# Patient Record
Sex: Female | Born: 1960 | Race: White | Hispanic: No | Marital: Married | Smoking: Never smoker
Health system: Southern US, Community
[De-identification: ages and names within clinical notes are randomized; demographics above are authoritative.]

## PROBLEM LIST (undated history)

## (undated) ENCOUNTER — Ambulatory Visit: Admission: EM

## (undated) DIAGNOSIS — E785 Hyperlipidemia, unspecified: Secondary | ICD-10-CM

## (undated) DIAGNOSIS — I4891 Unspecified atrial fibrillation: Secondary | ICD-10-CM

## (undated) DIAGNOSIS — K289 Gastrojejunal ulcer, unspecified as acute or chronic, without hemorrhage or perforation: Secondary | ICD-10-CM

## (undated) DIAGNOSIS — Z9889 Other specified postprocedural states: Secondary | ICD-10-CM

## (undated) DIAGNOSIS — Z7901 Long term (current) use of anticoagulants: Secondary | ICD-10-CM

## (undated) DIAGNOSIS — E669 Obesity, unspecified: Secondary | ICD-10-CM

## (undated) DIAGNOSIS — K219 Gastro-esophageal reflux disease without esophagitis: Secondary | ICD-10-CM

## (undated) DIAGNOSIS — T753XXA Motion sickness, initial encounter: Secondary | ICD-10-CM

## (undated) DIAGNOSIS — K2970 Gastritis, unspecified, without bleeding: Secondary | ICD-10-CM

## (undated) DIAGNOSIS — R112 Nausea with vomiting, unspecified: Secondary | ICD-10-CM

## (undated) DIAGNOSIS — I499 Cardiac arrhythmia, unspecified: Secondary | ICD-10-CM

## (undated) DIAGNOSIS — I10 Essential (primary) hypertension: Secondary | ICD-10-CM

## (undated) DIAGNOSIS — I700 Atherosclerosis of aorta: Secondary | ICD-10-CM

## (undated) HISTORY — DX: Gastro-esophageal reflux disease without esophagitis: K21.9

## (undated) HISTORY — DX: Essential (primary) hypertension: I10

## (undated) HISTORY — PX: CHOLECYSTECTOMY: SHX55

## (undated) HISTORY — DX: Obesity, unspecified: E66.9

## (undated) HISTORY — PX: ABDOMINAL HYSTERECTOMY: SHX81

## (undated) HISTORY — DX: Gastrojejunal ulcer, unspecified as acute or chronic, without hemorrhage or perforation: K28.9

## (undated) HISTORY — DX: Gastritis, unspecified, without bleeding: K29.70

## (undated) HISTORY — PX: OOPHORECTOMY: SHX86

## (undated) HISTORY — PX: CARDIAC ELECTROPHYSIOLOGY MAPPING AND ABLATION: SHX1292

## (undated) HISTORY — DX: Hyperlipidemia, unspecified: E78.5

---

## 2004-05-24 ENCOUNTER — Ambulatory Visit: Payer: Self-pay | Admitting: Internal Medicine

## 2004-06-18 ENCOUNTER — Ambulatory Visit: Payer: Self-pay | Admitting: Surgery

## 2005-09-19 ENCOUNTER — Other Ambulatory Visit: Payer: Self-pay

## 2005-09-27 ENCOUNTER — Ambulatory Visit: Payer: Self-pay | Admitting: Unknown Physician Specialty

## 2006-06-16 ENCOUNTER — Other Ambulatory Visit: Payer: Self-pay

## 2006-06-16 ENCOUNTER — Emergency Department: Payer: Self-pay

## 2007-10-26 ENCOUNTER — Other Ambulatory Visit: Payer: Self-pay

## 2007-10-26 ENCOUNTER — Inpatient Hospital Stay: Payer: Self-pay | Admitting: Internal Medicine

## 2007-10-29 ENCOUNTER — Other Ambulatory Visit: Payer: Self-pay

## 2007-12-27 ENCOUNTER — Ambulatory Visit: Payer: Self-pay | Admitting: Internal Medicine

## 2008-01-02 ENCOUNTER — Other Ambulatory Visit: Payer: Self-pay

## 2008-01-02 ENCOUNTER — Ambulatory Visit: Payer: Self-pay | Admitting: Internal Medicine

## 2008-01-22 ENCOUNTER — Ambulatory Visit: Payer: Self-pay | Admitting: Internal Medicine

## 2008-02-06 ENCOUNTER — Ambulatory Visit: Payer: Self-pay | Admitting: Internal Medicine

## 2008-02-06 ENCOUNTER — Other Ambulatory Visit: Payer: Self-pay

## 2008-05-08 HISTORY — PX: CARDIAC ELECTROPHYSIOLOGY MAPPING AND ABLATION: SHX1292

## 2008-08-04 DIAGNOSIS — I77810 Thoracic aortic ectasia: Secondary | ICD-10-CM

## 2008-08-04 HISTORY — DX: Thoracic aortic ectasia: I77.810

## 2009-01-22 ENCOUNTER — Ambulatory Visit: Payer: Self-pay | Admitting: Internal Medicine

## 2010-03-18 ENCOUNTER — Ambulatory Visit: Payer: Self-pay | Admitting: Internal Medicine

## 2010-09-06 ENCOUNTER — Ambulatory Visit: Payer: Self-pay | Admitting: Gastroenterology

## 2011-05-18 ENCOUNTER — Ambulatory Visit: Payer: Self-pay | Admitting: Internal Medicine

## 2012-07-10 ENCOUNTER — Ambulatory Visit: Payer: Self-pay | Admitting: Internal Medicine

## 2013-03-29 DIAGNOSIS — I493 Ventricular premature depolarization: Secondary | ICD-10-CM | POA: Insufficient documentation

## 2013-07-15 ENCOUNTER — Ambulatory Visit: Payer: Self-pay | Admitting: Internal Medicine

## 2014-08-28 ENCOUNTER — Ambulatory Visit: Payer: Self-pay | Admitting: Internal Medicine

## 2015-11-11 ENCOUNTER — Other Ambulatory Visit: Payer: Self-pay | Admitting: Internal Medicine

## 2015-11-11 DIAGNOSIS — Z1231 Encounter for screening mammogram for malignant neoplasm of breast: Secondary | ICD-10-CM

## 2015-12-02 ENCOUNTER — Other Ambulatory Visit: Payer: Self-pay | Admitting: Internal Medicine

## 2015-12-02 ENCOUNTER — Ambulatory Visit
Admission: RE | Admit: 2015-12-02 | Discharge: 2015-12-02 | Disposition: A | Discharge status: Home / Self Care | Payer: 59 | Source: Ambulatory Visit | Attending: Internal Medicine | Admitting: Internal Medicine

## 2015-12-02 DIAGNOSIS — Z1231 Encounter for screening mammogram for malignant neoplasm of breast: Secondary | ICD-10-CM

## 2016-05-05 ENCOUNTER — Encounter: Payer: Self-pay | Admitting: Emergency Medicine

## 2016-05-05 DIAGNOSIS — M7989 Other specified soft tissue disorders: Secondary | ICD-10-CM | POA: Diagnosis present

## 2016-05-05 DIAGNOSIS — R609 Edema, unspecified: Secondary | ICD-10-CM | POA: Diagnosis not present

## 2016-05-05 NOTE — ED Triage Notes (Signed)
Pt in with co area to left arm that is swollen and bruised. Denies any injury or pain, takes a asa 325mg  daily.

## 2016-05-06 ENCOUNTER — Emergency Department: Payer: 59

## 2016-05-06 ENCOUNTER — Emergency Department
Admission: EM | Admit: 2016-05-06 | Discharge: 2016-05-06 | Disposition: A | Discharge status: Home / Self Care | Payer: 59 | Attending: Emergency Medicine | Admitting: Emergency Medicine

## 2016-05-06 DIAGNOSIS — M7989 Other specified soft tissue disorders: Secondary | ICD-10-CM

## 2016-05-06 DIAGNOSIS — M79609 Pain in unspecified limb: Secondary | ICD-10-CM

## 2016-05-06 DIAGNOSIS — R609 Edema, unspecified: Secondary | ICD-10-CM

## 2016-05-06 DIAGNOSIS — M799 Soft tissue disorder, unspecified: Secondary | ICD-10-CM

## 2016-05-06 NOTE — ED Notes (Signed)
ED Provider at bedside at this time.

## 2016-05-06 NOTE — ED Provider Notes (Signed)
[facility] Emergency Department Provider Note  ____________________________________________  Time seen: 1:38AM  I have reviewed the triage vital signs and the nursing notes.   HISTORY  Chief Complaint Arm Pain     HPI Barbara Johnston is a 55 y.o. female presents with nontraumatic left forearm painless swelling and bruising. Patient stated that she noted the area today and denies any injury that she is aware of. Patient states that areas pain was to palpation. Patient denies any chest pain or shortness of breath.     Past medical history None There are no active problems to display for this patient.   Past surgical history None   Allergies Codeine  No family history on file.  Social History Social History  Substance Use Topics  . Smoking status: Not on file  . Smokeless tobacco: Not on file  . Alcohol use Not on file    Review of Systems  Constitutional: Negative for fever. Eyes: Negative for visual changes. ENT: Negative for sore throat. Cardiovascular: Negative for chest pain. Respiratory: Negative for shortness of breath. Gastrointestinal: Negative for abdominal pain, vomiting and diarrhea. Genitourinary: Negative for dysuria. Musculoskeletal: Negative for back pain. Skin: Negative for rash. Right forearm quarter size ecchymosis, swollen Neurological: Negative for headaches, focal weakness or numbness.  10-point ROS otherwise negative.  ____________________________________________   PHYSICAL EXAM:  VITAL SIGNS: ED Triage Vitals  Enc Vitals Group     BP 05/05/16 2248 (!) 158/86     Pulse Rate 05/05/16 2248 66     Resp 05/05/16 2248 18     Temp 05/05/16 2248 98.6 F (37 C)     Temp src --      SpO2 05/05/16 2248 100 %     Weight 05/05/16 2245 192 lb (87.1 kg)     Height 05/05/16 2245 5\' 2"  (1.575 m)     Head Circumference --      Peak Flow --      Pain Score 05/05/16 2246 2     Pain Loc --      Pain Edu? --       Excl. in [place]? --      Constitutional: Alert and oriented. Well appearing and in no distress. Eyes: Conjunctivae are normal. PERRL. Normal extraocular movements. ENT   Head: Normocephalic and atraumatic.   Nose: No congestion/rhinnorhea.   Mouth/Throat: Mucous membranes are moist.   Neck: No stridor. Hematological/Lymphatic/Immunilogical: No cervical lymphadenopathy. Cardiovascular: Normal rate, regular rhythm. Normal and symmetric distal pulses are present in all extremities. No murmurs, rubs, or gallops. Respiratory: Normal respiratory effort without tachypnea nor retractions. Breath sounds are clear and equal bilaterally. No wheezes/rales/rhonchi. Gastrointestinal: Soft and nontender. No distention. There is no CVA tenderness. Genitourinary: deferred Musculoskeletal: Nontender with normal range of motion in all extremities. No joint effusions.  No lower extremity tenderness nor edema. Neurologic:  Normal speech and language. No gross focal neurologic deficits are appreciated. Speech is normal.  Skin:  Quarter size area of swelling and ecchymoses noted dorsal aspect of the distal left forearm Psychiatric: Mood and affect are normal. Speech and behavior are normal. Patient exhibits appropriate insight and judgment.    RADIOLOGY  CLINICAL DATA:  Initial evaluation for nontraumatic left upper extremity swelling. Bruising.  EXAM: Left UPPER EXTREMITY VENOUS DOPPLER ULTRASOUND  TECHNIQUE: Gray-scale sonography with graded compression, as well as color Doppler and duplex ultrasound were performed to evaluate the upper extremity deep venous system from the level of the subclavian vein and including the  jugular, axillary, basilic, radial, ulnar and upper cephalic vein. Spectral Doppler was utilized to evaluate flow at rest and with distal augmentation maneuvers.  COMPARISON:  None available.  FINDINGS: Contralateral Subclavian Vein: Respiratory phasicity is normal  and symmetric with the symptomatic side. No evidence of thrombus. Normal compressibility.  Internal Jugular Vein: No evidence of thrombus. Normal compressibility, respiratory phasicity and response to augmentation.  Subclavian Vein: No evidence of thrombus. Normal compressibility, respiratory phasicity and response to augmentation.  Axillary Vein: No evidence of thrombus. Normal compressibility, respiratory phasicity and response to augmentation.  Cephalic Vein: No evidence of thrombus. Normal compressibility, respiratory phasicity and response to augmentation.  Basilic Vein: No evidence of thrombus. Normal compressibility, respiratory phasicity and response to augmentation.  Brachial Veins: No evidence of thrombus. Normal compressibility, respiratory phasicity and response to augmentation.  Radial Veins: No evidence of thrombus. Normal compressibility, respiratory phasicity and response to augmentation.  Ulnar Veins: No evidence of thrombus. Normal compressibility, respiratory phasicity and response to augmentation.  Venous Reflux:  None visualized.  Other Findings:  None visualized.  IMPRESSION: No evidence of deep venous thrombosis.   Electronically Signed   By: Jeannine Boga M.D.   On: 05/06/2016 03:14   Procedures      INITIAL IMPRESSION / ASSESSMENT AND PLAN / ED COURSE  Pertinent labs & imaging results that were available during my care of the patient were reviewed by me and considered in my medical decision making (see chart for details).  No clear etiology for the patient's area of ecchymoses and swelling on the forearm. Ultrasound venous study revealed no evidence of DVT or PE.   ____________________________________________   FINAL CLINICAL IMPRESSION(S) / ED DIAGNOSES  Final diagnoses:  Nontraumatic pain and swelling of upper extremity      Gregor Hams, MD 05/11/16 [phone]

## 2016-07-01 ENCOUNTER — Ambulatory Visit (INDEPENDENT_AMBULATORY_CARE_PROVIDER_SITE_OTHER): Payer: 59 | Admitting: Podiatry

## 2016-07-01 ENCOUNTER — Ambulatory Visit (INDEPENDENT_AMBULATORY_CARE_PROVIDER_SITE_OTHER): Payer: 59

## 2016-07-01 VITALS — BP 125/75 | HR 67 | Resp 16

## 2016-07-01 DIAGNOSIS — M25572 Pain in left ankle and joints of left foot: Secondary | ICD-10-CM

## 2016-07-01 DIAGNOSIS — M7752 Other enthesopathy of left foot: Secondary | ICD-10-CM | POA: Diagnosis not present

## 2016-07-01 DIAGNOSIS — M659 Synovitis and tenosynovitis, unspecified: Secondary | ICD-10-CM

## 2016-07-01 DIAGNOSIS — S93402A Sprain of unspecified ligament of left ankle, initial encounter: Secondary | ICD-10-CM

## 2016-07-01 MED ORDER — MELOXICAM 15 MG PO TABS
15.0000 mg | ORAL_TABLET | Freq: Every day | ORAL | 1 refills | Status: AC
Start: 2016-07-01 — End: 2016-07-31

## 2016-07-01 NOTE — Progress Notes (Signed)
   Subjective:    Patient ID: Barbara Johnston, female    DOB: Mar 26, 1961, 55 y.o.   MRN: [id number]  HPI    Review of Systems  All other systems reviewed and are negative.      Objective:   Physical Exam        Assessment & Plan:

## 2016-07-06 MED ORDER — BETAMETHASONE SOD PHOS & ACET 6 (3-3) MG/ML IJ SUSP: 3.0000 mg | Freq: Once | INTRAMUSCULAR | Status: DC

## 2016-07-06 NOTE — Progress Notes (Signed)
Patient ID: Barbara Johnston, female   DOB: 25-Dec-1960, 56 y.o.   MRN: [id number]   Subjective:  Patient presents today for pain and tenderness to the left ankle. Patient relates significant pain and tenderness when walking. Patient states that she rolled her ankle 3-4 weeks ago. He states that for the last 3 days is been feeling much better. Patient presents for further treatment and evaluation.  Objective / Physical Exam:  General:  The patient is alert and oriented x3 in no acute distress. Dermatology:  Skin is warm, dry and supple bilateral lower extremities. Negative for open lesions or macerations. Vascular:  Palpable pedal pulses bilaterally. No edema or erythema noted. Capillary refill within normal limits. Neurological:  Epicritic and protective threshold grossly intact bilaterally.  Musculoskeletal Exam:  Pain on palpation to the anterior lateral medial aspects of the patient's left ankle. Mild edema noted.  Range of motion within normal limits to all pedal and ankle joints bilateral. Muscle strength 5/5 in all groups bilateral.   Radiographic Exam:  Normal osseous mineralization. Joint spaces preserved. No fracture/dislocation/boney destruction.    Assessment: #1 pain in left ankle #2 synovitis of left ankle #3 capsulitis of left ankle #4 ankle sprain left  Plan of Care:  #1 Patient was evaluated. #2 injection of 0.5 mL Celestone Soluspan injected in the patient's left ankle. #3 prescription for meloxicam 15 mg  #4 ankle brace dispensed #5 return to clinic when necessary   Edrick Kins, DPM [facility]  Dr. Edrick Kins, [address]                                        [place], [place] [address]                Office [phone]  Fax [phone]

## 2016-11-09 DIAGNOSIS — E559 Vitamin D deficiency, unspecified: Secondary | ICD-10-CM | POA: Insufficient documentation

## 2016-11-10 ENCOUNTER — Other Ambulatory Visit: Payer: Self-pay | Admitting: Internal Medicine

## 2016-11-10 DIAGNOSIS — Z1231 Encounter for screening mammogram for malignant neoplasm of breast: Secondary | ICD-10-CM

## 2016-12-02 ENCOUNTER — Ambulatory Visit
Admission: RE | Admit: 2016-12-02 | Discharge: 2016-12-02 | Disposition: A | Discharge status: Home / Self Care | Payer: 59 | Source: Ambulatory Visit | Attending: Internal Medicine | Admitting: Internal Medicine

## 2016-12-02 DIAGNOSIS — Z1231 Encounter for screening mammogram for malignant neoplasm of breast: Secondary | ICD-10-CM | POA: Insufficient documentation

## 2017-10-13 ENCOUNTER — Emergency Department: Payer: BLUE CROSS/BLUE SHIELD

## 2017-10-13 ENCOUNTER — Encounter: Payer: Self-pay | Admitting: Emergency Medicine

## 2017-10-13 ENCOUNTER — Inpatient Hospital Stay
Admission: EM | Admit: 2017-10-13 | Discharge: 2017-10-17 | DRG: 193 | Disposition: A | Discharge status: Home / Self Care | Payer: BLUE CROSS/BLUE SHIELD | Attending: Internal Medicine | Admitting: Internal Medicine

## 2017-10-13 ENCOUNTER — Other Ambulatory Visit: Payer: Self-pay

## 2017-10-13 DIAGNOSIS — J181 Lobar pneumonia, unspecified organism: Principal | ICD-10-CM | POA: Diagnosis present

## 2017-10-13 DIAGNOSIS — Z79899 Other long term (current) drug therapy: Secondary | ICD-10-CM | POA: Diagnosis not present

## 2017-10-13 DIAGNOSIS — J9601 Acute respiratory failure with hypoxia: Secondary | ICD-10-CM | POA: Diagnosis present

## 2017-10-13 DIAGNOSIS — Z881 Allergy status to other antibiotic agents status: Secondary | ICD-10-CM

## 2017-10-13 DIAGNOSIS — Z7982 Long term (current) use of aspirin: Secondary | ICD-10-CM | POA: Diagnosis not present

## 2017-10-13 DIAGNOSIS — R55 Syncope and collapse: Secondary | ICD-10-CM | POA: Diagnosis not present

## 2017-10-13 DIAGNOSIS — A419 Sepsis, unspecified organism: Secondary | ICD-10-CM

## 2017-10-13 DIAGNOSIS — J189 Pneumonia, unspecified organism: Secondary | ICD-10-CM | POA: Diagnosis present

## 2017-10-13 DIAGNOSIS — I480 Paroxysmal atrial fibrillation: Secondary | ICD-10-CM | POA: Diagnosis present

## 2017-10-13 DIAGNOSIS — Z885 Allergy status to narcotic agent status: Secondary | ICD-10-CM

## 2017-10-13 DIAGNOSIS — Z90710 Acquired absence of both cervix and uterus: Secondary | ICD-10-CM | POA: Diagnosis not present

## 2017-10-13 DIAGNOSIS — I4892 Unspecified atrial flutter: Secondary | ICD-10-CM | POA: Diagnosis present

## 2017-10-13 HISTORY — DX: Unspecified atrial fibrillation: I48.91

## 2017-10-13 LAB — COMPREHENSIVE METABOLIC PANEL
ALBUMIN: 3.7 g/dL (ref 3.5–5.0)
ALT: 23 U/L (ref 14–54)
ANION GAP: 9 (ref 5–15)
AST: 30 U/L (ref 15–41)
Alkaline Phosphatase: 51 U/L (ref 38–126)
BUN: 18 mg/dL (ref 6–20)
CHLORIDE: 95 mmol/L — AB (ref 101–111)
CO2: 27 mmol/L (ref 22–32)
Calcium: 8.5 mg/dL — ABNORMAL LOW (ref 8.9–10.3)
Creatinine, Ser: 0.79 mg/dL (ref 0.44–1.00)
GFR calc Af Amer: >60 mL/min (ref >60)
GFR calc non Af Amer: >60 mL/min (ref >60)
GLUCOSE: 134 mg/dL — AB (ref 65–99)
POTASSIUM: 4.2 mmol/L (ref 3.5–5.1)
SODIUM: 131 mmol/L — AB (ref 135–145)
TOTAL PROTEIN: 7 g/dL (ref 6.5–8.1)
Total Bilirubin: 1.2 mg/dL (ref 0.3–1.2)

## 2017-10-13 LAB — CBC WITH DIFFERENTIAL/PLATELET
Basophils Absolute: 0 10*3/uL (ref 0–0.1)
Basophils Relative: 0 %
EOS ABS: 0 10*3/uL (ref 0–0.7)
Eosinophils Relative: 0 %
HCT: 43.4 % (ref 35.0–47.0)
HEMOGLOBIN: 14.9 g/dL (ref 12.0–16.0)
LYMPHS ABS: 0.9 10*3/uL — AB (ref 1.0–3.6)
LYMPHS PCT: 12 %
MCH: 30.6 pg (ref 26.0–34.0)
MCHC: 34.2 g/dL (ref 32.0–36.0)
MCV: 89.5 fL (ref 80.0–100.0)
Monocytes Absolute: 0.7 10*3/uL (ref 0.2–0.9)
Monocytes Relative: 9 %
NEUTROS PCT: 79 %
Neutro Abs: 6.3 10*3/uL (ref 1.4–6.5)
Platelets: 196 10*3/uL (ref 150–440)
RBC: 4.85 MIL/uL (ref 3.80–5.20)
RDW: 13 % (ref 11.5–14.5)
WBC: 8 10*3/uL (ref 3.6–11.0)

## 2017-10-13 LAB — URINALYSIS, ROUTINE W REFLEX MICROSCOPIC
Bilirubin Urine: NEGATIVE
GLUCOSE, UA: NEGATIVE mg/dL
HGB URINE DIPSTICK: NEGATIVE
Ketones, ur: NEGATIVE mg/dL
LEUKOCYTES UA: NEGATIVE
Nitrite: NEGATIVE
Protein, ur: NEGATIVE mg/dL
SPECIFIC GRAVITY, URINE: 1.003 — AB (ref 1.005–1.030)
pH: 7 (ref 5.0–8.0)

## 2017-10-13 LAB — TROPONIN I: Troponin I: <0.03 ng/mL (ref <0.03)

## 2017-10-13 LAB — LACTIC ACID, PLASMA
Lactic Acid, Venous: 1.6 mmol/L (ref 0.5–1.9)
Lactic Acid, Venous: 2 mmol/L (ref 0.5–1.9)
Lactic Acid, Venous: 1.5 mmol/L (ref 0.5–1.9)

## 2017-10-13 LAB — BRAIN NATRIURETIC PEPTIDE: B Natriuretic Peptide: 590 pg/mL — ABNORMAL HIGH (ref 0.0–100.0)

## 2017-10-13 MED ORDER — VITAMIN B-12 1000 MCG PO TABS
1000.0000 ug | ORAL_TABLET | Freq: Every day | ORAL | Status: DC
  Administered 2017-10-14 – 2017-10-17 (×4): 1000 ug via ORAL
  Filled 2017-10-13 (×4): qty 2
  Filled 2017-10-13: qty 1

## 2017-10-13 MED ORDER — DILTIAZEM HCL 25 MG/5ML IV SOLN
5.0000 mg | Freq: Once | INTRAVENOUS | Status: AC
  Administered 2017-10-13: 5 mg via INTRAVENOUS

## 2017-10-13 MED ORDER — DILTIAZEM HCL 100 MG IV SOLR
5.0000 mg/h | INTRAVENOUS | Status: DC
  Administered 2017-10-13: 15 mg/h via INTRAVENOUS
  Administered 2017-10-13: 5 mg/h via INTRAVENOUS
  Administered 2017-10-13: 7.5 mg/h via INTRAVENOUS
  Administered 2017-10-13: 10 mg/h via INTRAVENOUS
  Administered 2017-10-14: 15 mg/h via INTRAVENOUS
  Administered 2017-10-14: 10 mg/h via INTRAVENOUS
  Administered 2017-10-15: 5 mg/h via INTRAVENOUS
  Filled 2017-10-13 (×5): qty 100

## 2017-10-13 MED ORDER — MAGNESIUM OXIDE 400 (241.3 MG) MG PO TABS
400.0000 mg | ORAL_TABLET | Freq: Every day | ORAL | Status: DC
  Administered 2017-10-13 – 2017-10-17 (×5): 400 mg via ORAL
  Filled 2017-10-13 (×4): qty 1

## 2017-10-13 MED ORDER — ACETAMINOPHEN 325 MG PO TABS: 650.0000 mg | ORAL_TABLET | Freq: Four times a day (QID) | ORAL | Status: DC | PRN

## 2017-10-13 MED ORDER — SODIUM CHLORIDE 0.9 % IV SOLN
INTRAVENOUS | Status: DC
  Administered 2017-10-13 – 2017-10-14 (×2): via INTRAVENOUS

## 2017-10-13 MED ORDER — POTASSIUM CHLORIDE CRYS ER 10 MEQ PO TBCR
10.0000 meq | EXTENDED_RELEASE_TABLET | Freq: Every day | ORAL | Status: DC
  Administered 2017-10-13 – 2017-10-17 (×5): 10 meq via ORAL
  Filled 2017-10-13 (×4): qty 1

## 2017-10-13 MED ORDER — SODIUM CHLORIDE 0.9% FLUSH
3.0000 mL | Freq: Two times a day (BID) | INTRAVENOUS | Status: DC
  Administered 2017-10-13 – 2017-10-17 (×8): 3 mL via INTRAVENOUS

## 2017-10-13 MED ORDER — DILTIAZEM HCL 100 MG IV SOLR
5.0000 mg/h | INTRAVENOUS | Status: DC
  Filled 2017-10-13: qty 100

## 2017-10-13 MED ORDER — LEVOFLOXACIN IN D5W 750 MG/150ML IV SOLN
750.0000 mg | INTRAVENOUS | Status: DC
  Administered 2017-10-13: 750 mg via INTRAVENOUS
  Filled 2017-10-13 (×3): qty 150

## 2017-10-13 MED ORDER — DOFETILIDE 250 MCG PO CAPS
250.0000 ug | ORAL_CAPSULE | Freq: Two times a day (BID) | ORAL | Status: DC
  Administered 2017-10-13 – 2017-10-17 (×8): 250 ug via ORAL
  Filled 2017-10-13 (×9): qty 1

## 2017-10-13 MED ORDER — SODIUM CHLORIDE 0.9 % IV SOLN: 500.0000 mg | Freq: Once | INTRAVENOUS | Status: DC

## 2017-10-13 MED ORDER — PANTOPRAZOLE SODIUM 40 MG PO TBEC
40.0000 mg | DELAYED_RELEASE_TABLET | Freq: Every day | ORAL | Status: DC
  Administered 2017-10-14 – 2017-10-17 (×4): 40 mg via ORAL
  Filled 2017-10-13 (×4): qty 1

## 2017-10-13 MED ORDER — SODIUM CHLORIDE 0.9% FLUSH
3.0000 mL | INTRAVENOUS | Status: DC | PRN
  Administered 2017-10-16: 3 mL via INTRAVENOUS
  Filled 2017-10-13: qty 3

## 2017-10-13 MED ORDER — VITAMIN E 180 MG (400 UNIT) PO CAPS
400.0000 [IU] | ORAL_CAPSULE | Freq: Every day | ORAL | Status: DC
  Administered 2017-10-14 – 2017-10-17 (×4): 400 [IU] via ORAL
  Filled 2017-10-13 (×4): qty 1

## 2017-10-13 MED ORDER — SODIUM CHLORIDE 0.9 % IV SOLN: 250.0000 mL | INTRAVENOUS | Status: DC | PRN

## 2017-10-13 MED ORDER — ENOXAPARIN SODIUM 100 MG/ML ~~LOC~~ SOLN
1.0000 mg/kg | Freq: Two times a day (BID) | SUBCUTANEOUS | Status: DC
  Administered 2017-10-14: 95 mg via SUBCUTANEOUS
  Filled 2017-10-13: qty 1

## 2017-10-13 MED ORDER — ASPIRIN 325 MG PO TABS
325.0000 mg | ORAL_TABLET | Freq: Once | ORAL | Status: AC
  Administered 2017-10-13: 325 mg via ORAL
  Filled 2017-10-13: qty 1

## 2017-10-13 MED ORDER — SODIUM CHLORIDE 0.9 % IV BOLUS
1000.0000 mL | Freq: Once | INTRAVENOUS | Status: AC
  Administered 2017-10-13: 1000 mL via INTRAVENOUS

## 2017-10-13 MED ORDER — ENOXAPARIN SODIUM 100 MG/ML ~~LOC~~ SOLN
1.0000 mg/kg | Freq: Once | SUBCUTANEOUS | Status: AC
  Administered 2017-10-13: 95 mg via SUBCUTANEOUS
  Filled 2017-10-13: qty 1

## 2017-10-13 MED ORDER — SIMVASTATIN 20 MG PO TABS
40.0000 mg | ORAL_TABLET | Freq: Every day | ORAL | Status: DC
  Administered 2017-10-13 – 2017-10-16 (×4): 40 mg via ORAL
  Filled 2017-10-13 (×4): qty 2

## 2017-10-13 MED ORDER — DILTIAZEM LOAD VIA INFUSION
15.0000 mg | Freq: Once | INTRAVENOUS | Status: AC
  Administered 2017-10-13: 15 mg via INTRAVENOUS
  Filled 2017-10-13: qty 15

## 2017-10-13 MED ORDER — PIPERACILLIN-TAZOBACTAM 3.375 G IVPB 30 MIN
3.3750 g | Freq: Once | INTRAVENOUS | Status: AC
  Administered 2017-10-13: 3.375 g via INTRAVENOUS
  Filled 2017-10-13: qty 50

## 2017-10-13 MED ORDER — ONDANSETRON HCL 4 MG PO TABS: 4.0000 mg | ORAL_TABLET | Freq: Four times a day (QID) | ORAL | Status: DC | PRN

## 2017-10-13 MED ORDER — SODIUM CHLORIDE 0.9 % IV SOLN: 1.0000 g | Freq: Once | INTRAVENOUS | Status: DC

## 2017-10-13 MED ORDER — ADULT MULTIVITAMIN W/MINERALS CH
1.0000 | ORAL_TABLET | Freq: Every day | ORAL | Status: DC
  Administered 2017-10-13 – 2017-10-17 (×5): 1 via ORAL
  Filled 2017-10-13 (×4): qty 1

## 2017-10-13 MED ORDER — SENNOSIDES-DOCUSATE SODIUM 8.6-50 MG PO TABS: 1.0000 | ORAL_TABLET | Freq: Every evening | ORAL | Status: DC | PRN

## 2017-10-13 MED ORDER — DILTIAZEM HCL 25 MG/5ML IV SOLN
INTRAVENOUS | Status: AC
  Filled 2017-10-13: qty 5

## 2017-10-13 MED ORDER — ONDANSETRON HCL 4 MG/2ML IJ SOLN
4.0000 mg | Freq: Four times a day (QID) | INTRAMUSCULAR | Status: DC | PRN
  Administered 2017-10-15: 4 mg via INTRAVENOUS
  Filled 2017-10-13: qty 2

## 2017-10-13 MED ORDER — FUROSEMIDE 20 MG PO TABS
20.0000 mg | ORAL_TABLET | Freq: Every day | ORAL | Status: DC
  Administered 2017-10-14 – 2017-10-17 (×4): 20 mg via ORAL
  Filled 2017-10-13 (×4): qty 1

## 2017-10-13 MED ORDER — ACETAMINOPHEN 650 MG RE SUPP: 650.0000 mg | Freq: Four times a day (QID) | RECTAL | Status: DC | PRN

## 2017-10-13 NOTE — Progress Notes (Signed)
Advanced care plan.  Purpose of the Encounter: CODE STATUS Parties in Attendance: Patient Patient's Decision Capacity: Good Subjective/Patient's story: Presented to the emergency room for cough, shortness of breath and palpitations and passing out Objective/Medical story Patient has atrial fibrillation with rapid rate and also has right lung pneumonia Patient also hypoxia secondary to pneumonia.  Patient started on IV Cardizem drip for rate control and on IV antibiotics for pneumonia. Goals of care determination:  Advance directives were discussed with patient in detail Patient wants complete cardiac resuscitation, intubation, ventilator if need arises CODE STATUS: Full code Time spent discussing advanced care planning: 16 minutes

## 2017-10-13 NOTE — H&P (Signed)
[facility] NAME: Barbara Johnston    MR#:  [id number]  DATE OF BIRTH:  02/02/1961  DATE OF ADMISSION:  10/13/2017  PRIMARY CARE PHYSICIAN: Katheren Shams   REQUESTING/REFERRING PHYSICIAN:   CHIEF COMPLAINT:   Chief Complaint  Patient presents with  . Loss of Consciousness    HISTORY OF PRESENT ILLNESS: Barbara Johnston  is a 57 y.o. female with a known history of atrial fibrillation who had ablation in the past by [facility] cardiology presented to the emergency room for cough, difficulty breathing.  Patient has been treated with outpatient antibiotics for cough.  She also felt she passed out this morning.  She went to her doctor who found her in atrial flutter with rapid ventricular rate at a rate of 140 bpm.  Patient was referred to the emergency room.  Patient was found to be in atrial flutter she was put on IV Cardizem drip for rate control.  Patient was also worked up and was found to have pneumonia on chest x-ray and antibiotics were started.  Has productive cough of yellowish phlegm.No complaints of any chest pain. Hospitalist service was consulted for further care.  PAST MEDICAL HISTORY:   Past Medical History:  Diagnosis Date  . Atrial fibrillation ([place])     PAST SURGICAL HISTORY:  Past Surgical History:  Procedure Laterality Date  . ABDOMINAL HYSTERECTOMY    . CARDIAC ELECTROPHYSIOLOGY MAPPING AND ABLATION    . CHOLECYSTECTOMY      SOCIAL HISTORY:  Social History   Tobacco Use  . Smoking status: Never Smoker  . Smokeless tobacco: Never Used  Substance Use Topics  . Alcohol use: Yes    FAMILY HISTORY:  Family History  Problem Relation Age of Onset  . Heart disease Father   . Heart disease Brother   . Breast cancer Neg Hx     DRUG ALLERGIES:  Allergies  Allergen Reactions  . Codeine Other (See Comments)    Hallucinations   . Erythromycin     initeracts with home med    REVIEW OF SYSTEMS:    CONSTITUTIONAL: No fever, has fatigue and weakness.  EYES: No blurred or double vision.  EARS, NOSE, AND THROAT: No tinnitus or ear pain.  RESPIRATORY: Has cough, shortness of breath,  No wheezing or hemoptysis.  CARDIOVASCULAR: No chest pain, orthopnea, edema.  Has palpitations. GASTROINTESTINAL: No nausea, vomiting, diarrhea or abdominal pain.  GENITOURINARY: No dysuria, hematuria.  ENDOCRINE: No polyuria, nocturia,  HEMATOLOGY: No anemia, easy bruising or bleeding SKIN: No rash or lesion. MUSCULOSKELETAL: No joint pain or arthritis.   NEUROLOGIC: No tingling, numbness, weakness.  PSYCHIATRY: No anxiety or depression.   MEDICATIONS AT HOME:  Prior to Admission medications   Medication Sig Start Date End Date Taking? Authorizing Provider  aspirin 325 MG tablet Take 325 mg by mouth once.    Yes [provider]  Cyanocobalamin ([employer] B-12) 1000 MCG TBCR Take 1 mcg by mouth daily.    Yes [provider]  dofetilide (TIKOSYN) 250 MCG capsule Take 250 mcg by mouth 2 (two) times daily.  06/23/16  Yes [provider]  furosemide (LASIX) 20 MG tablet TAKE ONE (1) TABLET EACH DAY 08/12/15  Yes [provider]  magnesium oxide (MAG-OX) 400 MG tablet Take 400 mg by mouth daily.    Yes [provider]  metoprolol succinate (TOPROL-XL) 25 MG 24 hr tablet Take 50 mg by mouth daily.  06/21/16  Yes [provider]  metoprolol succinate (TOPROL-XL) 25 MG 24 hr tablet Take 25 mg by mouth at bedtime.   Yes [provider]  Multiple Vitamin (MULTI-VITAMINS) TABS Take 1 tablet by mouth daily.    Yes [provider]  omeprazole (PRILOSEC) 20 MG capsule Take 20 mg by mouth daily.    Yes [provider]  potassium chloride (K-DUR) 10 MEQ tablet TAKE ONE (1) TABLET EACH DAY 08/20/15  Yes [provider]  simvastatin (ZOCOR) 40 MG tablet Take 40 mg by mouth daily at 6 PM.  06/21/16  Yes [provider]  vitamin E 400  UNIT capsule Take 400 Units by mouth daily.    Yes [provider]      PHYSICAL EXAMINATION:   VITAL SIGNS: Blood pressure 111/73, pulse (!) 137, temperature 99.4 F (37.4 C), temperature source Oral, resp. rate (!) 23, height 5\' 2"  (1.575 m), weight 95.3 kg (210 lb), SpO2 95 %.  GENERAL:  57 y.o.-year-old patient lying in the bed on oxygen via nasal cannula EYES: Pupils equal, round, reactive to light and accommodation. No scleral icterus. Extraocular muscles intact.  HEENT: Head atraumatic, normocephalic. Oropharynx and nasopharynx clear.  NECK:  Supple, no jugular venous distention. No thyroid enlargement, no tenderness.  LUNGS: Decreased breath sounds bilaterally, Rales heard in the right lung. No use of accessory muscles of respiration.  CARDIOVASCULAR: S1, S2 irregular. No murmurs, rubs, or gallops.  ABDOMEN: Soft, nontender, nondistended. Bowel sounds present. No organomegaly or mass.  EXTREMITIES: No pedal edema, cyanosis, or clubbing.  NEUROLOGIC: Cranial nerves II through XII are intact. Muscle strength 5/5 in all extremities. Sensation intact. Gait not checked.  PSYCHIATRIC: The patient is alert and oriented x 3.  SKIN: No obvious rash, lesion, or ulcer.   LABORATORY PANEL:   CBC Recent Labs  Lab 10/13/17 1305  WBC 8.0  HGB 14.9  HCT 43.4  PLT 196  MCV 89.5  MCH 30.6  MCHC 34.2  RDW 13.0  LYMPHSABS 0.9*  MONOABS 0.7  EOSABS 0.0  BASOSABS 0.0   ------------------------------------------------------------------------------------------------------------------  Chemistries  Recent Labs  Lab 10/13/17 1305  NA 131*  K 4.2  CL 95*  CO2 27  GLUCOSE 134*  BUN 18  CREATININE 0.79  CALCIUM 8.5*  AST 30  ALT 23  ALKPHOS 51  BILITOT 1.2   ------------------------------------------------------------------------------------------------------------------ estimated creatinine clearance is 82.5 mL/min (by C-G formula based on SCr of 0.79  mg/dL). ------------------------------------------------------------------------------------------------------------------ No results for input(s): TSH, T4TOTAL, T3FREE, THYROIDAB in the last 72 hours.  Invalid input(s): FREET3   Coagulation profile No results for input(s): INR, PROTIME in the last 168 hours. ------------------------------------------------------------------------------------------------------------------- No results for input(s): DDIMER in the last 72 hours. -------------------------------------------------------------------------------------------------------------------  Cardiac Enzymes Recent Labs  Lab 10/13/17 1305  TROPONINI <0.03   ------------------------------------------------------------------------------------------------------------------ Invalid input(s): POCBNP  ---------------------------------------------------------------------------------------------------------------  Urinalysis No results found for: COLORURINE, APPEARANCEUR, LABSPEC, PHURINE, GLUCOSEU, HGBUR, BILIRUBINUR, KETONESUR, PROTEINUR, UROBILINOGEN, NITRITE, LEUKOCYTESUR   RADIOLOGY: Dg Chest Portable 1 View  Result Date: 10/13/2017 CLINICAL DATA:  Atrial flutter. Shortness of breath. Antibiotic treatment for bronchitis. EXAM: PORTABLE CHEST 1 VIEW COMPARISON:  Chest radiograph and CT 10/26/2007 FINDINGS: The cardiac silhouette is slightly enlarged. The lungs are hypoinflated with asymmetric airspace opacity in the right upper lobe including consolidation extending to the minor fissure. Mild streaky and patchy opacity is present in both lung bases. No sizable pleural effusion or pneumothorax is identified. No acute osseous abnormality is seen. IMPRESSION: 1. Right upper lobe pneumonia. Followup PA and lateral  chest X-ray is recommended in 3-4 weeks following trial of antibiotic therapy to ensure resolution and exclude underlying malignancy. 2. Mild bibasilar opacities may reflect additional  infection or atelectasis. Electronically Signed   By: Logan Bores M.D.   On: 10/13/2017 13:38    EKG: Orders placed or performed during the hospital encounter of 10/13/17  . EKG 12-Lead  . EKG 12-Lead  . EKG 12-Lead  . EKG 12-Lead  . ED EKG 12-Lead  . ED EKG 12-Lead    IMPRESSION AND PLAN:  57 year old female patient with history of atrial fibrillation presented to the emergency room with palpitations, cough, shortness of breath  Atrial fibrillation with rapid rate Admit patient to telemetry inpatient service Start patient on IV Cardizem drip for rate control Check echocardiogram Cycle troponin to rule out ischemia Cardiology consultation  Right lung community-acquired pneumonia Start patient on IV Rocephin and Zithromax antibiotics   Hypoxia secondary to pneumonia Oxygen via nasal cannula  DVT prophylaxis on anticoagulation with subcu Lovenox  Syncope Probably cardiac etiology. Cardiology evaluation    All the records are reviewed and case discussed with ED provider. Management plans discussed with the patient, family and they are in agreement.  CODE STATUS: Full code    Code Status Orders  (From admission, onward)        Start     Ordered   10/13/17 1512  Full code  Continuous     10/13/17 1512    Code Status History    This patient has a current code status but no historical code status.       TOTAL TIME TAKING CARE OF THIS PATIENT: 48 minutes.    Saundra Shelling M.D on 10/13/2017 at 3:50 PM  Between 7am to 6pm - Pager - [phone]  After 6pm go to [email or web address] - password [email or web address] [facility]  [facility]  Office  [phone]  CC: Primary care physician; Katheren Shams

## 2017-10-13 NOTE — Progress Notes (Signed)
Talked to Dr. Estanislado Pandy about patient's Lactic acid came back at 2.0 was 1.6 initially, order for NS at 100 ml/hr given and to check another lactic acid. Also talked to him about patient's HR fluctuating 110's-140's, patient's BP at 1650 was 99/59, then recheck again at 1746 and was 111/70, per MD ok to titrate cardizem drip as long as patient's BP is tolerating medication. No other concern at the moment. RN will continue to monitor.

## 2017-10-13 NOTE — ED Provider Notes (Addendum)
[facility] Emergency Department Provider Note   ____________________________________________   None    (approximate)  I have reviewed the triage vital signs and the nursing notes.   HISTORY  Chief Complaint Loss of Consciousness   HPI Barbara Johnston is a 57 y.o. female Who comes with a history of A. fib treated with as ablation. She's been coughing and was treated with with antibiotics by her doctor. This morning she passed out at 3:30 was having seizures some shortness of breath. She went to the doctor today and was found to be in a flutter with RVR EKG here showed a rate of 139. Patient had taken 30 of diltiazem herself without any result.she is not having any shortness of breath or chest pain at present.  Past Medical History:  Diagnosis Date  . Atrial fibrillation ([place])     There are no active problems to display for this patient.   Past Surgical History:  Procedure Laterality Date  . ABDOMINAL HYSTERECTOMY    . CARDIAC ELECTROPHYSIOLOGY MAPPING AND ABLATION    . CHOLECYSTECTOMY      Prior to Admission medications   Medication Sig Start Date End Date Taking? Authorizing Provider  aspirin 325 MG tablet Take by mouth.    [provider]  Cyanocobalamin ([employer] B-12) 1000 MCG/15ML LIQD Take by mouth.    [provider]  dofetilide (TIKOSYN) 250 MCG capsule Take by mouth. 06/23/16   [provider]  furosemide (LASIX) 20 MG tablet TAKE ONE (1) TABLET EACH DAY 08/12/15   [provider]  magnesium oxide (MAG-OX) 400 MG tablet Take by mouth.    [provider]  metoprolol succinate (TOPROL-XL) 25 MG 24 hr tablet  06/21/16   [provider]  Multiple Vitamin (MULTI-VITAMINS) TABS Take by mouth.    [provider]  omeprazole (PRILOSEC) 20 MG capsule Take by mouth.    [provider]  potassium chloride (K-DUR) 10 MEQ tablet TAKE ONE (1) TABLET EACH DAY 08/20/15   [provider]  simvastatin (ZOCOR) 40 MG tablet  06/21/16   [provider]  vitamin E 400 UNIT capsule Take by mouth.    [provider]    Allergies Codeine and Erythromycin  Family History  Problem Relation Age of Onset  . Breast cancer Neg Hx     Social History Social History   Tobacco Use  . Smoking status: Never Smoker  . Smokeless tobacco: Never Used  Substance Use Topics  . Alcohol use: Yes  . Drug use: Never    Review of Systems  Constitutional: No fever/chills Eyes: No visual changes. ENT: No sore throat. Cardiovascular: Denies chest pain. Respiratory: Denies shortness of breath. Gastrointestinal: No abdominal pain.  No nausea, no vomiting.  No diarrhea.  No constipation. Genitourinary: Negative for dysuria. Musculoskeletal: Negative for back pain. Skin: Negative for rash. Neurological: Negative for headaches, focal weakness   ____________________________________________   PHYSICAL EXAM:  VITAL SIGNS: ED Triage Vitals  Enc Vitals Group     BP 10/13/17 1301 122/83     Pulse Rate 10/13/17 1301 (!) 139     Resp 10/13/17 1301 (!) 22     Temp 10/13/17 1301 99.4 F (37.4 C)     Temp Source 10/13/17 1301 Oral     SpO2 10/13/17 1301 91 %     Weight 10/13/17 1256 210 lb (95.3 kg)     Height 10/13/17 1256 5\' 2"  (1.575 m)     Head  Circumference --      Peak Flow --      Pain Score 10/13/17 1256 0     Pain Loc --      Pain Edu? --      Excl. in [place]? --     Constitutional: Alert and oriented. Well appearing and in no acute distress. Eyes: Conjunctivae are normal. Head: Atraumatic. Nose: No congestion/rhinnorhea. Mouth/Throat: Mucous membranes are moist.  Oropharynx non-erythematous. Neck: No stridor.   Cardiovascular: rapid rate, regular rhythm. Grossly normal heart sounds.  Good peripheral circulation. Respiratory: Normal respiratory effort.  No retractions. Lungs crackles in bases bilaterally Gastrointestinal: Soft and nontender. No distention.  No abdominal bruits. No CVA tenderness. Musculoskeletal: No lower extremity tenderness nor edema.   Neurologic:  Normal speech and language. No gross focal neurologic deficits are appreciated. No gait instability. Skin:  Skin is warm, dry and intact. No rash noted. Psychiatric: Mood and affect are normal. Speech and behavior are normal.  ____________________________________________   LABS (all labs ordered are listed, but only abnormal results are displayed)  Labs Reviewed  CBC WITH DIFFERENTIAL/PLATELET - Abnormal; Notable for the following components:      Result Value   Lymphs Abs 0.9 (*)    All other components within normal limits  COMPREHENSIVE METABOLIC PANEL - Abnormal; Notable for the following components:   Sodium 131 (*)    Chloride 95 (*)    Glucose, Bld 134 (*)    Calcium 8.5 (*)    All other components within normal limits  CULTURE, BLOOD (ROUTINE X 2)  CULTURE, BLOOD (ROUTINE X 2)  TROPONIN I  LACTIC ACID, PLASMA  BRAIN NATRIURETIC PEPTIDE  URINALYSIS, ROUTINE W REFLEX MICROSCOPIC  LACTIC ACID, PLASMA   ____________________________________________  EKG  EKG read and interpreted by me shows a flutter with 2-1 conduction at a rate of 139 normal axis nonspecific ST-T changes probably rate related ____________________________________________  RADIOLOGY  ED MD interpretation: patient's x-ray shows a right-sided infiltrate consistent with pneumonia  Official radiology report(s): Dg Chest Portable 1 View  Result Date: 10/13/2017 CLINICAL DATA:  Atrial flutter. Shortness of breath. Antibiotic treatment for bronchitis. EXAM: PORTABLE CHEST 1 VIEW COMPARISON:  Chest radiograph and CT 10/26/2007 FINDINGS: The cardiac silhouette is slightly enlarged. The lungs are hypoinflated with asymmetric airspace opacity in the right upper lobe including consolidation extending to the minor fissure. Mild streaky and patchy opacity is present in both lung bases. No sizable pleural  effusion or pneumothorax is identified. No acute osseous abnormality is seen. IMPRESSION: 1. Right upper lobe pneumonia. Followup PA and lateral chest X-ray is recommended in 3-4 weeks following trial of antibiotic therapy to ensure resolution and exclude underlying malignancy. 2. Mild bibasilar opacities may reflect additional infection or atelectasis. Electronically Signed   By: Logan Bores M.D.   On: 10/13/2017 13:38    ____________________________________________   PROCEDURES  Procedure(s) performed:   Procedures  Critical Care performed:  critical care time 30 minutes. This includes reviewing the patient monitoring and fluid administration and then the diltiazem administration and discussing her with the hospitalist. ____________________________________________   INITIAL IMPRESSION / [place] / ED COURSE patient's heart rate is not coming down with just fluids and antibiotics. We'll try some diltiazem after all. Plan on admitting this patient with a diagnosis of a flutter with rapid ventricular response and sepsis. Patient has gotten antibiotics IV here along with fluids now we will try the diltiazem.          ____________________________________________  FINAL CLINICAL IMPRESSION(S) / ED DIAGNOSES  Final diagnoses:  Syncope and collapse  Community acquired pneumonia of right lung, unspecified part of lung  Sepsis, due to unspecified organism [facility])  Atrial flutter with rapid ventricular response [facility])     ED Discharge Orders    None       Note:  This document was prepared using Dragon voice recognition software and may include unintentional dictation errors.    Nena Polio, MD 10/13/17 1439    Nena Polio, MD 10/19/17 1350

## 2017-10-13 NOTE — Progress Notes (Signed)
Attempted to paged Dr. Estanislado Pandy twice to report about lactic acid being 2.0. Will try to paged MD again.

## 2017-10-13 NOTE — Progress Notes (Signed)
[place] for Lovenox Indication: atrial fibrillation  Allergies  Allergen Reactions  . Codeine Other (See Comments)    Hallucinations   . Erythromycin     initeracts with home med    Patient Measurements: Height: 5\' 2"  (157.5 cm) Weight: 210 lb (95.3 kg) IBW/kg (Calculated) : 50.1  Vital Signs: Temp: 99.4 F (37.4 C) (04/26 1301) Temp Source: Oral (04/26 1301) BP: 99/77 (04/26 1430) Pulse Rate: 154 (04/26 1430)  Labs: Recent Labs    10/13/17 1305  HGB 14.9  HCT 43.4  PLT 196  CREATININE 0.79  TROPONINI <0.03    Estimated Creatinine Clearance: 82.5 mL/min (by C-G formula based on SCr of 0.79 mg/dL).   Medical History: Past Medical History:  Diagnosis Date  . Atrial fibrillation [facility])     Assessment: 57 y/o F with a h/o AFIB s/p ablation on Tikosyn and not on anticoagulation PTA admitted with atrial flutter with rapid ventricular response.   Goal of Therapy:  Monitor platelets by anticoagulation protocol: Yes   Plan:  Lovenox 1 mg/kg q 12 hours.   [name] 10/13/2017,3:21 PM

## 2017-10-13 NOTE — ED Triage Notes (Addendum)
Was started on abx yesterday for bronchitis. Pt took 30 mg cardizem.  Passed out this morning at 330 am and was having some SHOB so went to doctor today.  Sent here for a flutter

## 2017-10-13 NOTE — Progress Notes (Signed)
Pharmacy Antibiotic Note  Barbara Johnston is a 57 y.o. female admitted on 10/13/2017 with pneumonia.  Pharmacy has been consulted for Levaquin dosing.   Plan: Levaquin 750 mg iv q 24 hours.   Height: 5\' 2"  (157.5 cm) Weight: 215 lb 12.8 oz (97.9 kg) IBW/kg (Calculated) : 50.1  Temp (24hrs), Avg:98.7 F (37.1 C), Min:98 F (36.7 C), Max:99.4 F (37.4 C)  Recent Labs  Lab 10/13/17 1305 10/13/17 1328  WBC 8.0  --   CREATININE 0.79  --   LATICACIDVEN  --  1.6    Estimated Creatinine Clearance: 83.7 mL/min (by C-G formula based on SCr of 0.79 mg/dL).    Allergies  Allergen Reactions  . Codeine Other (See Comments)    Hallucinations   . Erythromycin     initeracts with home med    Antimicrobials this admission: Zosyn 4/26 x 1 Levaquin 4/26 >>   Dose adjustments this admission:   Microbiology results: 4/26 BCx: sent  Thank you for allowing pharmacy to be a part of this patient's care.  Barbara Johnston 10/13/2017 5:00 PM

## 2017-10-13 NOTE — Progress Notes (Signed)
CODE SEPSIS - PHARMACY COMMUNICATION  **Broad Spectrum Antibiotics should be administered within 1 hour of Sepsis diagnosis**  Time Code Sepsis Called/Page Received: 1330  Antibiotics Ordered: Zosyn  Time of 1st antibiotic administration: 1335  Additional action taken by pharmacy:   If necessary, Name of Provider/Nurse Contacted:     Napoleon Form ,PharmD Clinical Pharmacist  10/13/2017  1:33 PM

## 2017-10-14 LAB — CBC
HCT: 41.9 % (ref 35.0–47.0)
Hemoglobin: 14.3 g/dL (ref 12.0–16.0)
MCH: 30.4 pg (ref 26.0–34.0)
MCHC: 34.1 g/dL (ref 32.0–36.0)
MCV: 89.2 fL (ref 80.0–100.0)
Platelets: 159 10*3/uL (ref 150–440)
RBC: 4.7 MIL/uL (ref 3.80–5.20)
RDW: 13 % (ref 11.5–14.5)
WBC: 5.2 10*3/uL (ref 3.6–11.0)

## 2017-10-14 LAB — BASIC METABOLIC PANEL
Anion gap: 7 (ref 5–15)
BUN: 16 mg/dL (ref 6–20)
CALCIUM: 8.1 mg/dL — AB (ref 8.9–10.3)
CO2: 25 mmol/L (ref 22–32)
CREATININE: 0.68 mg/dL (ref 0.44–1.00)
Chloride: 104 mmol/L (ref 101–111)
GFR calc Af Amer: >60 mL/min (ref >60)
GLUCOSE: 112 mg/dL — AB (ref 65–99)
Potassium: 3.9 mmol/L (ref 3.5–5.1)
Sodium: 136 mmol/L (ref 135–145)

## 2017-10-14 LAB — MAGNESIUM: Magnesium: 2 mg/dL (ref 1.7–2.4)

## 2017-10-14 LAB — HIV ANTIBODY (ROUTINE TESTING W REFLEX): HIV Screen 4th Generation wRfx: NONREACTIVE

## 2017-10-14 LAB — TROPONIN I

## 2017-10-14 MED ORDER — GUAIFENESIN 100 MG/5ML PO SOLN
10.0000 mL | ORAL | Status: DC
  Administered 2017-10-14 – 2017-10-17 (×13): 200 mg via ORAL
  Filled 2017-10-14 (×20): qty 10

## 2017-10-14 MED ORDER — SODIUM CHLORIDE 0.9 % IV SOLN
100.0000 mg | Freq: Two times a day (BID) | INTRAVENOUS | Status: DC
  Administered 2017-10-14 – 2017-10-15 (×2): 100 mg via INTRAVENOUS
  Filled 2017-10-14 (×3): qty 100

## 2017-10-14 MED ORDER — METOPROLOL SUCCINATE ER 50 MG PO TB24
50.0000 mg | ORAL_TABLET | Freq: Every day | ORAL | Status: DC
  Administered 2017-10-14 – 2017-10-17 (×4): 50 mg via ORAL
  Filled 2017-10-14 (×4): qty 1

## 2017-10-14 MED ORDER — BENZONATATE 100 MG PO CAPS
100.0000 mg | ORAL_CAPSULE | Freq: Three times a day (TID) | ORAL | Status: DC
  Administered 2017-10-14 – 2017-10-17 (×10): 100 mg via ORAL
  Filled 2017-10-14 (×10): qty 1

## 2017-10-14 MED ORDER — METOPROLOL SUCCINATE ER 25 MG PO TB24
25.0000 mg | ORAL_TABLET | Freq: Every day | ORAL | Status: DC
  Administered 2017-10-14 – 2017-10-16 (×3): 25 mg via ORAL
  Filled 2017-10-14 (×3): qty 1

## 2017-10-14 MED ORDER — SODIUM CHLORIDE 0.9 % IV SOLN
1.0000 g | INTRAVENOUS | Status: DC
  Administered 2017-10-14 – 2017-10-16 (×3): 1 g via INTRAVENOUS
  Filled 2017-10-14 (×4): qty 10

## 2017-10-14 MED ORDER — APIXABAN 5 MG PO TABS
5.0000 mg | ORAL_TABLET | Freq: Two times a day (BID) | ORAL | Status: DC
  Administered 2017-10-14 – 2017-10-17 (×6): 5 mg via ORAL
  Filled 2017-10-14 (×6): qty 1

## 2017-10-14 NOTE — Progress Notes (Signed)
[facility] [place] NAME: Barbara Johnston    MR#:  [id number]  DATE OF BIRTH:  1960/11/03  SUBJECTIVE:  presented with increasing cough and chest tightness found to have right upper lobe pneumonia. Currently on Cardizem drip heart rate fluctuates with activity anywhere from 120 to 140s.  REVIEW OF SYSTEMS:   Review of Systems  Constitutional: Negative for chills, fever and weight loss.  HENT: Negative for ear discharge, ear pain and nosebleeds.   Eyes: Negative for blurred vision, pain and discharge.  Respiratory: Positive for cough, sputum production and shortness of breath. Negative for wheezing and stridor.   Cardiovascular: Positive for palpitations. Negative for chest pain, orthopnea and PND.  Gastrointestinal: Negative for abdominal pain, diarrhea, nausea and vomiting.  Genitourinary: Negative for frequency and urgency.  Musculoskeletal: Negative for back pain and joint pain.  Neurological: Negative for sensory change, speech change, focal weakness and weakness.  Psychiatric/Behavioral: Negative for depression and hallucinations. The patient is not nervous/anxious.    Tolerating Diet:yesTolerating PT: patient is ambulatory  DRUG ALLERGIES:   Allergies  Allergen Reactions  . Codeine Other (See Comments)    Hallucinations   . Erythromycin     initeracts with home med    VITALS:  Blood pressure 113/84, pulse (!) 156, temperature 99.8 F (37.7 C), temperature source Oral, resp. rate 18, height 5\' 2"  (1.575 m), weight 97.9 kg (215 lb 12.8 oz), SpO2 90 %.  PHYSICAL EXAMINATION:   Physical Exam  GENERAL:  57 y.o.-year-old patient lying in the bed with no acute distress.  EYES: Pupils equal, round, reactive to light and accommodation. No scleral icterus. Extraocular muscles intact.  HEENT: Head atraumatic, normocephalic. Oropharynx and nasopharynx clear.  NECK:  Supple, no jugular venous distention. No thyroid enlargement,  no tenderness.  LUNGS: coarse breath sounds bilaterally, no wheezing, rales, rhonchi. No use of accessory muscles of respiration.  CARDIOVASCULAR: S1, S2 normal. No murmurs, rubs, or gallops. Tachycardia, if flutter fib ABDOMEN: Soft, nontender, nondistended. Bowel sounds present. No organomegaly or mass.  EXTREMITIES: No cyanosis, clubbing or edema b/l.    NEUROLOGIC: Cranial nerves II through XII are intact. No focal Motor or sensory deficits b/l.   PSYCHIATRIC:  patient is alert and oriented x 3.  SKIN: No obvious rash, lesion, or ulcer.   LABORATORY PANEL:  CBC Recent Labs  Lab 10/14/17 0102  WBC 5.2  HGB 14.3  HCT 41.9  PLT 159    Chemistries  Recent Labs  Lab 10/13/17 1305 10/14/17 0102  NA 131* 136  K 4.2 3.9  CL 95* 104  CO2 27 25  GLUCOSE 134* 112*  BUN 18 16  CREATININE 0.79 0.68  CALCIUM 8.5* 8.1*  AST 30  --   ALT 23  --   ALKPHOS 51  --   BILITOT 1.2  --    Cardiac Enzymes Recent Labs  Lab 10/14/17 0102  TROPONINI <0.03   RADIOLOGY:  Dg Chest Portable 1 View  Result Date: 10/13/2017 CLINICAL DATA:  Atrial flutter. Shortness of breath. Antibiotic treatment for bronchitis. EXAM: PORTABLE CHEST 1 VIEW COMPARISON:  Chest radiograph and CT 10/26/2007 FINDINGS: The cardiac silhouette is slightly enlarged. The lungs are hypoinflated with asymmetric airspace opacity in the right upper lobe including consolidation extending to the minor fissure. Mild streaky and patchy opacity is present in both lung bases. No sizable pleural effusion or pneumothorax is identified. No acute osseous abnormality is seen. IMPRESSION: 1. Right upper lobe pneumonia.  Followup PA and lateral chest X-ray is recommended in 3-4 weeks following trial of antibiotic therapy to ensure resolution and exclude underlying malignancy. 2. Mild bibasilar opacities may reflect additional infection or atelectasis. Electronically Signed   By: Logan Bores M.D.   On: 10/13/2017 13:38   ASSESSMENT AND  PLAN:   Barbara Johnston  is a 57 y.o. female with a known history of atrial fibrillation who had ablation in the past by [facility] cardiology presented to the emergency room for cough, difficulty breathing.  Patient has been treated with outpatient antibiotics for cough.  She also felt she passed out this morning.  She went to her doctor who found her in atrial flutter with rapid ventricular rate at a rate of 140 bpm.  *Atrial fibrillation with rapid rate in the setting of right upper lobe pneumonia -cont patient on IV Cardizem drip for rate control -resume tikosyn, beta blockers -cardiology consultation -troponin negative -electrolytes okay, check magnesium  *syncopal episode at home weakness my husband -suspected due to rapid afib  *Right lung community-acquired pneumonia - on IV Rocephin and doxycycline antibiotics  -scheduled cough meds  *Hypoxia secondary to pneumonia Oxygen via nasal cannula  *DVT prophylaxis on anticoagulation with subcu Lovenox   Case discussed with Care Management/Social Worker. Management plans discussed with the patient, family and they are in agreement.  CODE STATUS: full   DVT Prophylaxis: lovenox  TOTAL TIME TAKING CARE OF THIS PATIENT: 35 minutes.  >50% time spent on counselling and coordination of care  POSSIBLE D/C IN *1-2DAYS, DEPENDING ON CLINICAL CONDITION.  Note: This dictation was prepared with Dragon dictation along with smaller phrase technology. Any transcriptional errors that result from this process are unintentional.  Fritzi Mandes M.D on 10/14/2017 at 8:24 AM  Between 7am to 6pm - Pager - [phone]  After 6pm go to [email or web address] - password [email or web address] [facility]  Office  [phone]  CC: Primary care physician; Katheren Shams Patient ID: SHATIKA GRINNELL, female   DOB: 1960/09/24, 57 y.o.   MRN: [id number]

## 2017-10-14 NOTE — Progress Notes (Signed)
Spoke with dr. Posey Pronto to make aware patient sat is 89 on 2l. Increased to 4l. Per md okay to order incentive spirometer. Patient instructed on how to use the spirometer and use about every hour. Will continue to monitor

## 2017-10-14 NOTE — Consult Note (Signed)
[facility] Cardiology  CARDIOLOGY CONSULT NOTE  Patient ID: Barbara Johnston MRN: [id number] DOB/AGE: April 02, 1961 57 y.o.  Admit date: 10/13/2017 Referring Physician Pyreddy Primary Physician Sabra Heck Primary Cardiologist Nehemiah Massed Reason for Consultation atrial fibrillation/atrial flutter  HPI: 57 year old female referred for evaluation of atrial fibrillation/atrial flutter with rapid ventricular rate.  Patient has known history of paroxysmal atrial fibrillation/atrial flutter, status post catheter ablation 05/2008 [facility], with infrequent recurrences of atrial fibrillation, on metoprolol and dofetilide rate and rhythm control.  Has chads Vasc 2, has been hesitant to be on chronic anticoagulation, currently on aspirin.  He presents to [facility] ED with adductive cough and shortness of breath, chest x-ray revealing right upper lobe pneumonia, refractory to outpatient antibiotics.  She noted to be tachycardic, ECG reveals atrial flutter at a rate of 139 bpm.  She is minimally symptomatic, notes occasional palpitations.  Currently on Cardizem drip for rate control.  Review of systems complete and found to be negative unless listed above     Past Medical History:  Diagnosis Date  . Atrial fibrillation [facility])     Past Surgical History:  Procedure Laterality Date  . ABDOMINAL HYSTERECTOMY    . CARDIAC ELECTROPHYSIOLOGY MAPPING AND ABLATION    . CHOLECYSTECTOMY      Facility-Administered Medications Prior to Admission  Medication Dose Route Frequency Provider Last Rate Last Dose  . betamethasone acetate-betamethasone sodium phosphate (CELESTONE) injection 3 mg  3 mg Intramuscular Once Edrick Kins, DPM       Medications Prior to Admission  Medication Sig Dispense Refill Last Dose  . aspirin 325 MG tablet Take 325 mg by mouth once.    10/12/2017 at 2000  . Cyanocobalamin ([employer] B-12) 1000 MCG TBCR Take 1 mcg by mouth daily.    10/12/2017 at 0800  . dofetilide (TIKOSYN) 250 MCG capsule Take 250 mcg by mouth 2  (two) times daily.    10/13/2017 at 0800  . furosemide (LASIX) 20 MG tablet TAKE ONE (1) TABLET EACH DAY   10/13/2017 at 0800  . magnesium oxide (MAG-OX) 400 MG tablet Take 400 mg by mouth daily.    10/12/2017 at 0800  . metoprolol succinate (TOPROL-XL) 25 MG 24 hr tablet Take 50 mg by mouth daily.    10/13/2017 at 0800  . metoprolol succinate (TOPROL-XL) 25 MG 24 hr tablet Take 25 mg by mouth at bedtime.   10/12/2017 at 2000  . Multiple Vitamin (MULTI-VITAMINS) TABS Take 1 tablet by mouth daily.    10/12/2017 at 0800  . omeprazole (PRILOSEC) 20 MG capsule Take 20 mg by mouth daily.    10/12/2017 at 0800  . potassium chloride (K-DUR) 10 MEQ tablet TAKE ONE (1) TABLET EACH DAY   10/12/2017 at 0800  . simvastatin (ZOCOR) 40 MG tablet Take 40 mg by mouth daily at 6 PM.    10/12/2017 at 1800  . vitamin E 400 UNIT capsule Take 400 Units by mouth daily.    10/12/2017 at 0800   Social History   Socioeconomic History  . Marital status: Married    Spouse name: Not on file  . Number of children: Not on file  . Years of education: Not on file  . Highest education level: Not on file  Occupational History    Employer: [employer]  Social Needs  . Financial resource strain: Not on file  . Food insecurity:    Worry: Not on file    Inability: Not on file  . Transportation needs:    Medical: Not  on file    Non-medical: Not on file  Tobacco Use  . Smoking status: Never Smoker  . Smokeless tobacco: Never Used  Substance and Sexual Activity  . Alcohol use: Yes  . Drug use: Never  . Sexual activity: Not on file  Lifestyle  . Physical activity:    Days per week: Not on file    Minutes per session: Not on file  . Stress: Not on file  Relationships  . Social connections:    Talks on phone: Not on file    Gets together: Not on file    Attends religious service: Not on file    Active member of club or organization: Not on file    Attends meetings of clubs or organizations: Not on file     Relationship status: Not on file  . Intimate partner violence:    Fear of current or ex partner: Not on file    Emotionally abused: Not on file    Physically abused: Not on file    Forced sexual activity: Not on file  Other Topics Concern  . Not on file  Social History Narrative  . Not on file    Family History  Problem Relation Age of Onset  . Heart disease Father   . Heart disease Brother   . Breast cancer Neg Hx       Review of systems complete and found to be negative unless listed above      PHYSICAL EXAM  General: Well developed, well nourished, in no acute distress HEENT:  Normocephalic and atramatic Neck:  No JVD.  Lungs: Clear bilaterally to auscultation and percussion. Heart: HRRR . Normal S1 and S2 without gallops or murmurs.  Abdomen: Bowel sounds are positive, abdomen soft and non-tender  Msk:  Back normal, normal gait. Normal strength and tone for age. Extremities: No clubbing, cyanosis or edema.   Neuro: Alert and oriented X 3. Psych:  Good affect, responds appropriately  Labs:   Lab Results  Component Value Date   WBC 5.2 10/14/2017   HGB 14.3 10/14/2017   HCT 41.9 10/14/2017   MCV 89.2 10/14/2017   PLT 159 10/14/2017    Recent Labs  Lab 10/13/17 1305 10/14/17 0102  NA 131* 136  K 4.2 3.9  CL 95* 104  CO2 27 25  BUN 18 16  CREATININE 0.79 0.68  CALCIUM 8.5* 8.1*  PROT 7.0  --   BILITOT 1.2  --   ALKPHOS 51  --   ALT 23  --   AST 30  --   GLUCOSE 134* 112*   Lab Results  Component Value Date   TROPONINI <0.03 10/14/2017   No results found for: CHOL No results found for: HDL No results found for: LDLCALC No results found for: TRIG No results found for: CHOLHDL No results found for: LDLDIRECT    Radiology: Dg Chest Portable 1 View  Result Date: 10/13/2017 CLINICAL DATA:  Atrial flutter. Shortness of breath. Antibiotic treatment for bronchitis. EXAM: PORTABLE CHEST 1 VIEW COMPARISON:  Chest radiograph and CT 10/26/2007 FINDINGS:  The cardiac silhouette is slightly enlarged. The lungs are hypoinflated with asymmetric airspace opacity in the right upper lobe including consolidation extending to the minor fissure. Mild streaky and patchy opacity is present in both lung bases. No sizable pleural effusion or pneumothorax is identified. No acute osseous abnormality is seen. IMPRESSION: 1. Right upper lobe pneumonia. Followup PA and lateral chest X-ray is recommended in 3-4 weeks following trial of antibiotic  therapy to ensure resolution and exclude underlying malignancy. 2. Mild bibasilar opacities may reflect additional infection or atelectasis. Electronically Signed   By: Logan Bores M.D.   On: 10/13/2017 13:38    EKG: Atrial flutter at rate 139 bpm  ASSESSMENT AND PLAN:   1.  Recurrent atrial flutter, with rapid ventricular rate, in the setting of right upper lobe pneumonia.  She appears to be clinically and hemodynamically stable.  On Cardizem drip for rate control.  Gust in length, the potential benefits of being on chronic anticoagulation.  We discussed the risks, benefits and alternatives of chronic anticoagulation, as well as, the advantages and disadvantages of warfarin versus a novel oral anticoagulants.  Patient is at least agreeable to be on Eliquis for temporarily for stroke prevention, while she is recovering from pneumonia, and for potential electrical cardioversion near future if patient mains in atrial fibrillation/atrial flutter. 2.  Right upper lobe pneumonia  Recommendations  1.  Continue current medications 2.  Start Eliquis 5 mg twice daily 3.  Continue to closely monitor on telemetry 4.  Continue Cardizem drip for now, titrate as needed for rate control 5.  Continue metoprolol succinate 6.  Continue dofetilide for now  Signed: Isaias Cowman MD,PhD, [facility] 10/14/2017, 9:23 AM

## 2017-10-14 NOTE — Progress Notes (Signed)
Dr. Posey Pronto on the floor made aware patients heart rate still ranging from 110's to 140. Currently on cardizem drip at 15. Cardiology order was placed this am

## 2017-10-14 NOTE — Progress Notes (Signed)
[place] at [place] NAME: Barbara Johnston    MR#:  [id number]  DATE OF BIRTH:  1960-08-18  SUBJECTIVE:  presented with increasing cough and chest tightness found to have right upper lobe pneumonia. Currently on Cardizem drip heart rate fluctuates with activity anywhere from 120 to 140s.  REVIEW OF SYSTEMS:   Review of Systems  Constitutional: Negative for chills, fever and weight loss.  HENT: Negative for ear discharge, ear pain and nosebleeds.   Eyes: Negative for blurred vision, pain and discharge.  Respiratory: Positive for cough, sputum production and shortness of breath. Negative for wheezing and stridor.   Cardiovascular: Positive for palpitations. Negative for chest pain, orthopnea and PND.  Gastrointestinal: Negative for abdominal pain, diarrhea, nausea and vomiting.  Genitourinary: Negative for frequency and urgency.  Musculoskeletal: Negative for back pain and joint pain.  Neurological: Negative for sensory change, speech change, focal weakness and weakness.  Psychiatric/Behavioral: Negative for depression and hallucinations. The patient is not nervous/anxious.    Tolerating Diet:yesTolerating PT: patient is ambulatory  DRUG ALLERGIES:   Allergies  Allergen Reactions  . Codeine Other (See Comments)    Hallucinations   . Erythromycin     initeracts with home med    VITALS:  Blood pressure 113/84, pulse (!) 156, temperature 99.8 F (37.7 C), temperature source Oral, resp. rate 18, height 5\' 2"  (1.575 m), weight 97.9 kg (215 lb 12.8 oz), SpO2 90 %.  PHYSICAL EXAMINATION:   Physical Exam  GENERAL:  57 y.o.-year-old patient lying in the bed with no acute distress.  EYES: Pupils equal, round, reactive to light and accommodation. No scleral icterus. Extraocular muscles intact.  HEENT: Head atraumatic, normocephalic. Oropharynx and nasopharynx clear.  NECK:  Supple, no jugular venous distention. No thyroid enlargement,  no tenderness.  LUNGS: coarse breath sounds bilaterally, no wheezing, rales, rhonchi. No use of accessory muscles of respiration.  CARDIOVASCULAR: S1, S2 normal. No murmurs, rubs, or gallops. Tachycardia, if flutter fib ABDOMEN: Soft, nontender, nondistended. Bowel sounds present. No organomegaly or mass.  EXTREMITIES: No cyanosis, clubbing or edema b/l.    NEUROLOGIC: Cranial nerves II through XII are intact. No focal Motor or sensory deficits b/l.   PSYCHIATRIC:  patient is alert and oriented x 3.  SKIN: No obvious rash, lesion, or ulcer.   LABORATORY PANEL:  CBC Recent Labs  Lab 10/14/17 0102  WBC 5.2  HGB 14.3  HCT 41.9  PLT 159    Chemistries  Recent Labs  Lab 10/13/17 1305 10/14/17 0102  NA 131* 136  K 4.2 3.9  CL 95* 104  CO2 27 25  GLUCOSE 134* 112*  BUN 18 16  CREATININE 0.79 0.68  CALCIUM 8.5* 8.1*  MG  --  2.0  AST 30  --   ALT 23  --   ALKPHOS 51  --   BILITOT 1.2  --    Cardiac Enzymes Recent Labs  Lab 10/14/17 0102  TROPONINI <0.03   RADIOLOGY:  Dg Chest Portable 1 View  Result Date: 10/13/2017 CLINICAL DATA:  Atrial flutter. Shortness of breath. Antibiotic treatment for bronchitis. EXAM: PORTABLE CHEST 1 VIEW COMPARISON:  Chest radiograph and CT 10/26/2007 FINDINGS: The cardiac silhouette is slightly enlarged. The lungs are hypoinflated with asymmetric airspace opacity in the right upper lobe including consolidation extending to the minor fissure. Mild streaky and patchy opacity is present in both lung bases. No sizable pleural effusion or pneumothorax is identified. No acute osseous abnormality is seen.  IMPRESSION: 1. Right upper lobe pneumonia. Followup PA and lateral chest X-ray is recommended in 3-4 weeks following trial of antibiotic therapy to ensure resolution and exclude underlying malignancy. 2. Mild bibasilar opacities may reflect additional infection or atelectasis. Electronically Signed   By: Logan Bores M.D.   On: 10/13/2017 13:38    ASSESSMENT AND PLAN:   Barbara Johnston  is a 57 y.o. female with a known history of atrial fibrillation who had ablation in the past by [facility] cardiology presented to the emergency room for cough, difficulty breathing.  Patient has been treated with outpatient antibiotics for cough.  She also felt she passed out this morning.  She went to her doctor who found her in atrial flutter with rapid ventricular rate at a rate of 140 bpm.  *Atrial fibrillation with rapid rate in the setting of right upper lobe pneumonia -cont patient on IV Cardizem drip for rate control -resume tikosyn, beta blockers -cardiology consultation, ?anticoagulation -troponin negative -electrolytes okay, check magnesium  *syncopal episode at home weakness my husband -suspected due to rapid afib  *Right lung community-acquired pneumonia - on IV Rocephin and doxycycline antibiotics  -scheduled cough meds  *Hypoxia secondary to pneumonia Oxygen via nasal cannula  *DVT prophylaxis on anticoagulation with subcu Lovenox   Case discussed with Care Management/Social Worker. Management plans discussed with the patient, family and they are in agreement.  CODE STATUS: full   DVT Prophylaxis: lovenox  TOTAL TIME TAKING CARE OF THIS PATIENT: 35 minutes.  >50% time spent on counselling and coordination of care  POSSIBLE D/C IN *1-2DAYS, DEPENDING ON CLINICAL CONDITION.  Note: This dictation was prepared with Dragon dictation along with smaller phrase technology. Any transcriptional errors that result from this process are unintentional.  Fritzi Mandes M.D on 10/14/2017 at 9:40 AM  Between 7am to 6pm - Pager - [phone]  After 6pm go to [email or web address] - password [email or web address] [place] [facility]  Office  [phone]  CC: Primary care physician; Katheren Shams Patient ID: Barbara Johnston, female   DOB: 1960-12-02, 57 y.o.   MRN: [id number]

## 2017-10-14 NOTE — Progress Notes (Signed)
ANTICOAGULATION CONSULT NOTE - Initial Consult  Pharmacy Consult for apixaban Indication: atrial fibrillation  Allergies  Allergen Reactions  . Codeine Other (See Comments)    Hallucinations   . Erythromycin     initeracts with home med    Patient Measurements: Height: 5\' 2"  (157.5 cm) Weight: 215 lb 12.8 oz (97.9 kg) IBW/kg (Calculated) : 50.1  Vital Signs: Temp: 99.8 F (37.7 C) (04/27 0727) Temp Source: Oral (04/27 0727) BP: 113/84 (04/27 0727) Pulse Rate: 156 (04/27 0727)  Labs: Recent Labs    10/13/17 1305 10/13/17 1852 10/14/17 0102  HGB 14.9  --  14.3  HCT 43.4  --  41.9  PLT 196  --  159  CREATININE 0.79  --  0.68  TROPONINI <0.03 <0.03 <0.03    Estimated Creatinine Clearance: 83.7 mL/min (by C-G formula based on SCr of 0.68 mg/dL).   Medical History: Past Medical History:  Diagnosis Date  . Atrial fibrillation (HCC)     Medications:  Facility-Administered Medications Prior to Admission  Medication Dose Route Frequency Provider Last Rate Last Dose  . betamethasone acetate-betamethasone sodium phosphate (CELESTONE) injection 3 mg  3 mg Intramuscular Once Edrick Kins, DPM       Medications Prior to Admission  Medication Sig Dispense Refill Last Dose  . aspirin 325 MG tablet Take 325 mg by mouth once.    10/12/2017 at 2000  . Cyanocobalamin ([employer] B-12) 1000 MCG TBCR Take 1 mcg by mouth daily.    10/12/2017 at 0800  . dofetilide (TIKOSYN) 250 MCG capsule Take 250 mcg by mouth 2 (two) times daily.    10/13/2017 at 0800  . furosemide (LASIX) 20 MG tablet TAKE ONE (1) TABLET EACH DAY   10/13/2017 at 0800  . magnesium oxide (MAG-OX) 400 MG tablet Take 400 mg by mouth daily.    10/12/2017 at 0800  . metoprolol succinate (TOPROL-XL) 25 MG 24 hr tablet Take 50 mg by mouth daily.    10/13/2017 at 0800  . metoprolol succinate (TOPROL-XL) 25 MG 24 hr tablet Take 25 mg by mouth at bedtime.   10/12/2017 at 2000  . Multiple Vitamin (MULTI-VITAMINS) TABS Take 1 tablet by  mouth daily.    10/12/2017 at 0800  . omeprazole (PRILOSEC) 20 MG capsule Take 20 mg by mouth daily.    10/12/2017 at 0800  . potassium chloride (K-DUR) 10 MEQ tablet TAKE ONE (1) TABLET EACH DAY   10/12/2017 at 0800  . simvastatin (ZOCOR) 40 MG tablet Take 40 mg by mouth daily at 6 PM.    10/12/2017 at 1800  . vitamin E 400 UNIT capsule Take 400 Units by mouth daily.    10/12/2017 at 0800   Scheduled:  . apixaban  5 mg Oral BID  . benzonatate  100 mg Oral TID  . dofetilide  250 mcg Oral BID  . furosemide  20 mg Oral Daily  . guaiFENesin  10 mL Oral Q4H while awake  . magnesium oxide  400 mg Oral Daily  . metoprolol succinate  25 mg Oral QHS  . metoprolol succinate  50 mg Oral Daily  . multivitamin with minerals  1 tablet Oral Daily  . pantoprazole  40 mg Oral Daily  . potassium chloride  10 mEq Oral Daily  . simvastatin  40 mg Oral q1800  . sodium chloride flush  3 mL Intravenous Q12H  . vitamin B-12  1,000 mcg Oral Daily  . vitamin E  400 Units Oral Daily   Infusions:  .  sodium chloride    . cefTRIAXone (ROCEPHIN)  IV    . diltiazem (CARDIZEM) infusion 15 mg/hr (10/14/17 0616)  . doxycycline (VIBRAMYCIN) IV     PRN: sodium chloride, acetaminophen **OR** acetaminophen, ondansetron **OR** ondansetron (ZOFRAN) IV, senna-docusate, sodium chloride flush Anti-infectives (From admission, onward)   Start     Dose/Rate Route Frequency Ordered Stop   10/14/17 1800  cefTRIAXone (ROCEPHIN) 1 g in sodium chloride 0.9 % 100 mL IVPB     1 g 200 mL/hr over 30 Minutes Intravenous Every 24 hours 10/14/17 0813     10/14/17 1800  doxycycline (VIBRAMYCIN) 100 mg in sodium chloride 0.9 % 250 mL IVPB     100 mg 125 mL/hr over 120 Minutes Intravenous Every 12 hours 10/14/17 0813     10/13/17 1800  levofloxacin (LEVAQUIN) IVPB 750 mg  Status:  Discontinued     750 mg 100 mL/hr over 90 Minutes Intravenous Every 24 hours 10/13/17 1700 10/14/17 0813   10/13/17 1330  cefTRIAXone (ROCEPHIN) 1 g in sodium  chloride 0.9 % 100 mL IVPB  Status:  Discontinued     1 g 200 mL/hr over 30 Minutes Intravenous  Once 10/13/17 1320 10/13/17 1324   10/13/17 1330  azithromycin (ZITHROMAX) 500 mg in sodium chloride 0.9 % 250 mL IVPB  Status:  Discontinued     500 mg 250 mL/hr over 60 Minutes Intravenous  Once 10/13/17 1320 10/13/17 1324   10/13/17 1330  piperacillin-tazobactam (ZOSYN) IVPB 3.375 g     3.375 g 100 mL/hr over 30 Minutes Intravenous  Once 10/13/17 1324 10/13/17 1405      Assessment: 57 year old female with afib requiring anticoagulation with apixaban, just received enoxaparin 1mg /kg subq. Will start apixaban 5mg  po bid at time due of next enoxaparin per best practice.    Goal of Therapy:  anticoagulation with apixaban  Monitor platelets by anticoagulation protocol: Yes   Plan:  apixaban 5mg  po bid starting tonight. Pharmacy to continue to monitor   [name] 10/14/2017,9:41 AM

## 2017-10-15 MED ORDER — PROMETHAZINE HCL 25 MG PO TABS: 12.5000 mg | ORAL_TABLET | Freq: Three times a day (TID) | ORAL | Status: DC | PRN

## 2017-10-15 MED ORDER — DOXYCYCLINE HYCLATE 100 MG PO TABS
100.0000 mg | ORAL_TABLET | Freq: Two times a day (BID) | ORAL | Status: DC
  Administered 2017-10-15 – 2017-10-17 (×5): 100 mg via ORAL
  Filled 2017-10-15 (×5): qty 1

## 2017-10-15 MED ORDER — DILTIAZEM HCL 30 MG PO TABS
30.0000 mg | ORAL_TABLET | Freq: Four times a day (QID) | ORAL | Status: DC
  Administered 2017-10-15: 30 mg via ORAL
  Filled 2017-10-15: qty 1

## 2017-10-15 NOTE — Progress Notes (Signed)
[place] at [place] NAME: Barbara Johnston    MR#:  [id number]  DATE OF BIRTH:  January 28, 1961  SUBJECTIVE:  presented with increasing cough and chest tightness found to have right upper lobe pneumonia. Currently on Cardizem drip HR  At 5mg /hr. HR in 80-100's. Patient feels better today. Breathing improved. REVIEW OF SYSTEMS:   Review of Systems  Constitutional: Negative for chills, fever and weight loss.  HENT: Negative for ear discharge, ear pain and nosebleeds.   Eyes: Negative for blurred vision, pain and discharge.  Respiratory: Positive for cough, sputum production and shortness of breath. Negative for wheezing and stridor.   Cardiovascular: Positive for palpitations. Negative for chest pain, orthopnea and PND.  Gastrointestinal: Negative for abdominal pain, diarrhea, nausea and vomiting.  Genitourinary: Negative for frequency and urgency.  Musculoskeletal: Negative for back pain and joint pain.  Neurological: Negative for sensory change, speech change, focal weakness and weakness.  Psychiatric/Behavioral: Negative for depression and hallucinations. The patient is not nervous/anxious.    Tolerating Diet:yesTolerating PT: patient is ambulatory  DRUG ALLERGIES:   Allergies  Allergen Reactions  . Codeine Other (See Comments)    Hallucinations   . Erythromycin     initeracts with home med    VITALS:  Blood pressure 102/60, pulse 82, temperature 99 F (37.2 C), temperature source Oral, resp. rate (!) 22, height 5\' 2"  (1.575 m), weight 97.9 kg (215 lb 12.8 oz), SpO2 93 %.  PHYSICAL EXAMINATION:   Physical Exam  GENERAL:  58 y.o.-year-old patient lying in the bed with no acute distress.  EYES: Pupils equal, round, reactive to light and accommodation. No scleral icterus. Extraocular muscles intact.  HEENT: Head atraumatic, normocephalic. Oropharynx and nasopharynx clear.  NECK:  Supple, no jugular venous distention. No thyroid  enlargement, no tenderness.  LUNGS: coarse breath sounds bilaterally, no wheezing, rales, rhonchi. No use of accessory muscles of respiration.  CARDIOVASCULAR: S1, S2 normal. No murmurs, rubs, or gallops. Tachycardia ABDOMEN: Soft, nontender, nondistended. Bowel sounds present. No organomegaly or mass.  EXTREMITIES: No cyanosis, clubbing or edema b/l.    NEUROLOGIC: Cranial nerves II through XII are intact. No focal Motor or sensory deficits b/l.   PSYCHIATRIC:  patient is alert and oriented x 3.  SKIN: No obvious rash, lesion, or ulcer.   LABORATORY PANEL:  CBC Recent Labs  Lab 10/14/17 0102  WBC 5.2  HGB 14.3  HCT 41.9  PLT 159    Chemistries  Recent Labs  Lab 10/13/17 1305 10/14/17 0102  NA 131* 136  K 4.2 3.9  CL 95* 104  CO2 27 25  GLUCOSE 134* 112*  BUN 18 16  CREATININE 0.79 0.68  CALCIUM 8.5* 8.1*  MG  --  2.0  AST 30  --   ALT 23  --   ALKPHOS 51  --   BILITOT 1.2  --    Cardiac Enzymes Recent Labs  Lab 10/14/17 2220  TROPONINI <0.03   RADIOLOGY:  Dg Chest Portable 1 View  Result Date: 10/13/2017 CLINICAL DATA:  Atrial flutter. Shortness of breath. Antibiotic treatment for bronchitis. EXAM: PORTABLE CHEST 1 VIEW COMPARISON:  Chest radiograph and CT 10/26/2007 FINDINGS: The cardiac silhouette is slightly enlarged. The lungs are hypoinflated with asymmetric airspace opacity in the right upper lobe including consolidation extending to the minor fissure. Mild streaky and patchy opacity is present in both lung bases. No sizable pleural effusion or pneumothorax is identified. No acute osseous abnormality is seen. IMPRESSION:  1. Right upper lobe pneumonia. Followup PA and lateral chest X-ray is recommended in 3-4 weeks following trial of antibiotic therapy to ensure resolution and exclude underlying malignancy. 2. Mild bibasilar opacities may reflect additional infection or atelectasis. Electronically Signed   By: Logan Bores M.D.   On: 10/13/2017 13:38    ASSESSMENT AND PLAN:   Barbara Johnston  is a 57 y.o. female with a known history of atrial fibrillation who had ablation in the past by [facility] cardiology presented to the emergency room for cough, difficulty breathing.  Patient has been treated with outpatient antibiotics for cough.  She also felt she passed out this morning.  She went to her doctor who found her in atrial flutter with rapid ventricular rate at a rate of 140 bpm.  *Atrial fibrillation with rapid rate in the setting of right upper lobe pneumonia -cont patient on IV Cardizem drip for rate control-- will wean off Cardizem and placed on Cardizem 30 mg Q ID  -resume tikosyn, beta blockers -cardiology consultation appreciated. Patient is started on oral anticoagulation. -troponin negative -electrolytes okay  *syncopal episode at home weakness my husband -suspected due to rapid afib  *Right lung community-acquired pneumonia - on IV Rocephin and doxycycline antibiotics  -scheduled cough meds -wean oxygen down as tolerated - Incentive spirometer  *Hypoxia secondary to pneumonia Oxygen via nasal cannula  *DVT prophylaxis on anticoagulation with subcu Lovenox   Case discussed with Care Management/Social Worker. Management plans discussed with the patient, family and they are in agreement.  CODE STATUS: full   DVT Prophylaxis: lovenox  TOTAL TIME TAKING CARE OF THIS PATIENT: 35 minutes.  >50% time spent on counselling and coordination of care  POSSIBLE D/C IN *1-2DAYS, DEPENDING ON CLINICAL CONDITION.  Note: This dictation was prepared with Dragon dictation along with smaller phrase technology. Any transcriptional errors that result from this process are unintentional.  Fritzi Mandes M.D on 10/15/2017 at 8:36 AM  Between 7am to 6pm - Pager - [phone]  After 6pm go to [email or web address] - password [email or web address] [place] [facility]  Office  [phone]  CC: Primary care physician; Katheren Shams  Patient ID: Barbara Johnston, female   DOB: 12-Aug-1960, 57 y.o.   MRN: [id number]

## 2017-10-15 NOTE — Progress Notes (Signed)
[facility]  SUBJECTIVE: Patient laying in bed, denies chest pain or shortness of breath   Vitals:   10/14/17 1628 10/14/17 1938 10/15/17 0346 10/15/17 0826  BP: 100/66 113/76 107/72 102/60  Pulse: (!) 136 (!) 106 71 82  Resp: 18 16 16  (!) 22  Temp: 99.7 F (37.6 C) 98.1 F (36.7 C) 97.9 F (36.6 C) 99 F (37.2 C)  TempSrc: Oral Oral Oral Oral  SpO2: 92% 94% 92% 93%  Weight:      Height:         Intake/Output Summary (Last 24 hours) at 10/15/2017 1031 Last data filed at 10/15/2017 1027 Gross per 24 hour  Intake 1683 ml  Output 1701 ml  Net -18 ml      PHYSICAL EXAM  General: Well developed, well nourished, in no acute distress HEENT:  Normocephalic and atramatic Neck:  No JVD.  Lungs: Clear bilaterally to auscultation and percussion. Heart: HRRR . Normal S1 and S2 without gallops or murmurs.  Abdomen: Bowel sounds are positive, abdomen soft and non-tender  Msk:  Back normal, normal gait. Normal strength and tone for age. Extremities: No clubbing, cyanosis or edema.   Neuro: Alert and oriented X 3. Psych:  Good affect, responds appropriately   LABS: Basic Metabolic Panel: Recent Labs    10/13/17 1305 10/14/17 0102  NA 131* 136  K 4.2 3.9  CL 95* 104  CO2 27 25  GLUCOSE 134* 112*  BUN 18 16  CREATININE 0.79 0.68  CALCIUM 8.5* 8.1*  MG  --  2.0   Liver Function Tests: Recent Labs    10/13/17 1305  AST 30  ALT 23  ALKPHOS 51  BILITOT 1.2  PROT 7.0  ALBUMIN 3.7   No results for input(s): LIPASE, AMYLASE in the last 72 hours. CBC: Recent Labs    10/13/17 1305 10/14/17 0102  WBC 8.0 5.2  NEUTROABS 6.3  --   HGB 14.9 14.3  HCT 43.4 41.9  MCV 89.5 89.2  PLT 196 159   Cardiac Enzymes: Recent Labs    10/13/17 1852 10/14/17 0102 10/14/17 2220  TROPONINI <0.03 <0.03 <0.03   BNP: Invalid input(s): POCBNP D-Dimer: No results for input(s): DDIMER in the last 72 hours. Hemoglobin A1C: No results for input(s): HGBA1C in the last 72  hours. Fasting Lipid Panel: No results for input(s): CHOL, HDL, LDLCALC, TRIG, CHOLHDL, LDLDIRECT in the last 72 hours. Thyroid Function Tests: No results for input(s): TSH, T4TOTAL, T3FREE, THYROIDAB in the last 72 hours.  Invalid input(s): FREET3 Anemia Panel: No results for input(s): VITAMINB12, FOLATE, FERRITIN, TIBC, IRON, RETICCTPCT in the last 72 hours.  Dg Chest Portable 1 View  Result Date: 10/13/2017 CLINICAL DATA:  Atrial flutter. Shortness of breath. Antibiotic treatment for bronchitis. EXAM: PORTABLE CHEST 1 VIEW COMPARISON:  Chest radiograph and CT 10/26/2007 FINDINGS: The cardiac silhouette is slightly enlarged. The lungs are hypoinflated with asymmetric airspace opacity in the right upper lobe including consolidation extending to the minor fissure. Mild streaky and patchy opacity is present in both lung bases. No sizable pleural effusion or pneumothorax is identified. No acute osseous abnormality is seen. IMPRESSION: 1. Right upper lobe pneumonia. Followup PA and lateral chest X-ray is recommended in 3-4 weeks following trial of antibiotic therapy to ensure resolution and exclude underlying malignancy. 2. Mild bibasilar opacities may reflect additional infection or atelectasis. Electronically Signed   By: Logan Bores M.D.   On: 10/13/2017 13:38     Echo   TELEMETRY: Sinus rhythm:  ASSESSMENT AND PLAN:  Active Problems:   Pneumonia    1.  Atrial flutter with rapid ventricular rate, converted to sinus rhythm 2.  Right upper lobe pneumonia  Recommendations  1.  Continue Eliquis for stroke prevention 2.  DC Cardizem 3.  Uptitrate metoprolol succinate 4.  Continue dofetilide  Sign off for now, please call with any questions   Isaias Cowman, MD, PhD, [facility] 10/15/2017 10:31 AM

## 2017-10-16 NOTE — Progress Notes (Signed)
Pt ambulated at length in hallway. Weaned 02 down to [employer]. sats 91-91% on this. No change in pulserate. Pt tolerated exercise well.

## 2017-10-16 NOTE — Progress Notes (Signed)
[facility] [place] NAME: Barbara Johnston    MR#:  [id number]  DATE OF BIRTH:  December 18, 1960  SUBJECTIVE:  Off cardizem. Breathing improved. Cough+ REVIEW OF SYSTEMS:   Review of Systems  Constitutional: Negative for chills, fever and weight loss.  HENT: Negative for ear discharge, ear pain and nosebleeds.   Eyes: Negative for blurred vision, pain and discharge.  Respiratory: Positive for cough, sputum production and shortness of breath. Negative for wheezing and stridor.   Cardiovascular: Positive for palpitations. Negative for chest pain, orthopnea and PND.  Gastrointestinal: Negative for abdominal pain, diarrhea, nausea and vomiting.  Genitourinary: Negative for frequency and urgency.  Musculoskeletal: Negative for back pain and joint pain.  Neurological: Negative for sensory change, speech change, focal weakness and weakness.  Psychiatric/Behavioral: Negative for depression and hallucinations. The patient is not nervous/anxious.    Tolerating Diet:yesTolerating PT: patient is ambulatory  DRUG ALLERGIES:   Allergies  Allergen Reactions  . Codeine Other (See Comments)    Hallucinations   . Erythromycin     initeracts with home med    VITALS:  Blood pressure 119/74, pulse 79, temperature 97.9 F (36.6 C), temperature source Oral, resp. rate 16, height 5\' 2"  (1.575 m), weight 97.9 kg (215 lb 12.8 oz), SpO2 95 %.  PHYSICAL EXAMINATION:   Physical Exam  GENERAL:  57 y.o.-year-old patient lying in the bed with no acute distress.  EYES: Pupils equal, round, reactive to light and accommodation. No scleral icterus. Extraocular muscles intact.  HEENT: Head atraumatic, normocephalic. Oropharynx and nasopharynx clear.  NECK:  Supple, no jugular venous distention. No thyroid enlargement, no tenderness.  LUNGS: coarse breath sounds bilaterally, no wheezing, rales, rhonchi. No use of accessory muscles of respiration.  CARDIOVASCULAR:  S1, S2 normal. No murmurs, rubs, or gallops.  ABDOMEN: Soft, nontender, nondistended. Bowel sounds present. No organomegaly or mass.  EXTREMITIES: No cyanosis, clubbing or edema b/l.    NEUROLOGIC: Cranial nerves II through XII are intact. No focal Motor or sensory deficits b/l.   PSYCHIATRIC:  patient is alert and oriented x 3.  SKIN: No obvious rash, lesion, or ulcer.   LABORATORY PANEL:  CBC Recent Labs  Lab 10/14/17 0102  WBC 5.2  HGB 14.3  HCT 41.9  PLT 159    Chemistries  Recent Labs  Lab 10/13/17 1305 10/14/17 0102  NA 131* 136  K 4.2 3.9  CL 95* 104  CO2 27 25  GLUCOSE 134* 112*  BUN 18 16  CREATININE 0.79 0.68  CALCIUM 8.5* 8.1*  MG  --  2.0  AST 30  --   ALT 23  --   ALKPHOS 51  --   BILITOT 1.2  --    Cardiac Enzymes Recent Labs  Lab 10/14/17 2220  TROPONINI <0.03   RADIOLOGY:  No results found. ASSESSMENT AND PLAN:   Barbara Johnston  is a 57 y.o. female with a known history of atrial fibrillation who had ablation in the past by [facility] cardiology presented to the emergency room for cough, difficulty breathing.  Patient has been treated with outpatient antibiotics for cough.  She also felt she passed out this morning.  She went to her doctor who found her in atrial flutter with rapid ventricular rate at a rate of 140 bpm.  *Atrial fibrillation with rapid rate in the setting of right upper lobe pneumonia -cont patient now off IV Cardizem drip for rate control -Cardizem 30 mg Q ID ---now d/ced -  resume tikosyn, beta blockers -cardiology consultation appreciated. Patient is started on oral anticoagulation. -troponin negative -electrolytes okay  *syncopal episode at home weakness my husband -suspected due to rapid afib  *Right lung community-acquired pneumonia - on IV Rocephin and doxycycline antibiotics  -scheduled cough meds -wean oxygen down as tolerated - Incentive spirometer  *Hypoxia secondary to pneumonia Oxygen via nasal cannula -wean to  RA  *DVT prophylaxis on anticoagulation with subcu Lovenox  Ambulate and OOB to chair  Case discussed with Care Management/Social Worker. Management plans discussed with the patient, family and they are in agreement.  CODE STATUS: full   DVT Prophylaxis: lovenox  TOTAL TIME TAKING CARE OF THIS PATIENT: 25 minutes.  >50% time spent on counselling and coordination of care  POSSIBLE D/C IN *1-2 DAYS, DEPENDING ON CLINICAL CONDITION.  Note: This dictation was prepared with Dragon dictation along with smaller phrase technology. Any transcriptional errors that result from this process are unintentional.  Fritzi Mandes M.D on 10/16/2017 at 9:47 AM  Between 7am to 6pm - Pager - [phone]  After 6pm go to [email or web address] - password [email or web address] [facility]  Office  [phone]  CC: Primary care physician; Katheren Shams Patient ID: Barbara Johnston, female   DOB: [birth year], 59 y.o.   MRN: [id number]

## 2017-10-16 NOTE — Progress Notes (Signed)
Ambulated patient around nurses station twice. Pulse ox dropped to 88% while on 2 liters, increased to 3 liters to maintain sats above 91%.

## 2017-10-17 MED ORDER — CEFDINIR 300 MG PO CAPS
300.0000 mg | ORAL_CAPSULE | Freq: Two times a day (BID) | ORAL | Status: DC
  Filled 2017-10-17: qty 1

## 2017-10-17 MED ORDER — DOXYCYCLINE HYCLATE 100 MG PO TABS: 100.0000 mg | ORAL_TABLET | Freq: Two times a day (BID) | ORAL | 0 refills | Status: AC

## 2017-10-17 MED ORDER — CEFDINIR 300 MG PO CAPS: 300.0000 mg | ORAL_CAPSULE | Freq: Two times a day (BID) | ORAL | 0 refills | Status: AC

## 2017-10-17 MED ORDER — BENZONATATE 100 MG PO CAPS: 100.0000 mg | ORAL_CAPSULE | Freq: Three times a day (TID) | ORAL | 0 refills | Status: DC | PRN

## 2017-10-17 MED ORDER — APIXABAN 5 MG PO TABS: 5.0000 mg | ORAL_TABLET | Freq: Two times a day (BID) | ORAL | 1 refills | Status: DC

## 2017-10-17 NOTE — Progress Notes (Signed)
SATURATION QUALIFICATIONS: (This note is used to comply with regulatory documentation for home oxygen)  Patient Saturations on Room Air at Rest = 90%  Patient Saturations on Room Air while Ambulating = 85%  Patient Saturations on 2 Liters of oxygen while Ambulating = 94%  Please briefly explain why patient needs home oxygen: X  Barbara Johnston

## 2017-10-17 NOTE — Discharge Summary (Signed)
[facility] [place] NAME: Barbara Johnston    MR#:  [id number]  DATE OF BIRTH:  April 29, 1961  DATE OF ADMISSION:  10/13/2017 ADMITTING PHYSICIAN: Saundra Shelling, MD  DATE OF DISCHARGE: 10/17/2017 PRIMARY CARE PHYSICIAN: Rusty Aus, MD    ADMISSION DIAGNOSIS:  Syncope and collapse [R55] Atrial flutter with rapid ventricular response (HCC) [I48.92] Sepsis, due to unspecified organism ([place]) [A41.9] Community acquired pneumonia of right lung, unspecified part of lung [J18.9]  DISCHARGE DIAGNOSIS:  Right ML pneumonia Sepsis due to pneumonia Afib with RVR  SECONDARY DIAGNOSIS:   Past Medical History:  Diagnosis Date  . Atrial fibrillation [facility])     HOSPITAL COURSE:  WandaMitchellis a56 y.o.femalewith a known history of atrial fibrillation who had ablation in the past by [facility] cardiology presented to the emergency room for cough, difficulty breathing. Patient has been treated with outpatient antibiotics for cough. She also felt she passed out this morning. She went to her doctor who found her in atrial flutter with rapid ventricular rate at a rate of 140 bpm.  *Atrial fibrillation with rapid rate in the setting of right upper lobe pneumonia -cont patient now off IV Cardizem drip for rate control -Cardizem 30 mg Q ID ---now d/ced -resume tikosyn, beta blockers -cardiology consultation appreciated. Patient is started on oral anticoagulation on eliquis -troponin negative -electrolytes okay  *syncopal episode at home weakness my husband -suspected due to rapid afib  *Right lung community-acquired pneumonia - on IV Rocephin and doxycycline antibiotics--change to oral meds -scheduled cough meds -wean oxygen down as tolerated--sats >91% on RA with ambulation - Incentive spirometer  *Hypoxia secondary to pneumonia Oxygen via nasal cannula--now on RA -wean to RA  *DVT prophylaxis on anticoagulation with subcu  Lovenox  Overall improved. D/c home  CONSULTS OBTAINED:    DRUG ALLERGIES:   Allergies  Allergen Reactions  . Codeine Other (See Comments)    Hallucinations   . Erythromycin     initeracts with home med    DISCHARGE MEDICATIONS:   Allergies as of 10/17/2017      Reactions   Codeine Other (See Comments)   Hallucinations    Erythromycin    initeracts with home med      Medication List    TAKE these medications   apixaban 5 MG Tabs tablet Commonly known as:  ELIQUIS Take 1 tablet (5 mg total) by mouth 2 (two) times daily.   aspirin 325 MG tablet Take 325 mg by mouth once.   benzonatate 100 MG capsule Commonly known as:  TESSALON Take 1 capsule (100 mg total) by mouth 3 (three) times daily as needed for cough.   cefdinir 300 MG capsule Commonly known as:  OMNICEF Take 1 capsule (300 mg total) by mouth every 12 (twelve) hours for 5 days.   CVS B-12 1000 MCG Tbcr Generic drug:  Cyanocobalamin Take 1 mcg by mouth daily.   doxycycline 100 MG tablet Commonly known as:  VIBRA-TABS Take 1 tablet (100 mg total) by mouth every 12 (twelve) hours for 5 days.   furosemide 20 MG tablet Commonly known as:  LASIX TAKE ONE (1) TABLET EACH DAY   magnesium oxide 400 MG tablet Commonly known as:  MAG-OX Take 400 mg by mouth daily.   metoprolol succinate 25 MG 24 hr tablet Commonly known as:  TOPROL-XL Take 25 mg by mouth at bedtime.   metoprolol succinate 25 MG 24 hr tablet Commonly known as:  TOPROL-XL Take 50  mg by mouth daily.   MULTI-VITAMINS Tabs Take 1 tablet by mouth daily.   omeprazole 20 MG capsule Commonly known as:  PRILOSEC Take 20 mg by mouth daily.   potassium chloride 10 MEQ tablet Commonly known as:  K-DUR TAKE ONE (1) TABLET EACH DAY   simvastatin 40 MG tablet Commonly known as:  ZOCOR Take 40 mg by mouth daily at 6 PM.   TIKOSYN 250 MCG capsule Generic drug:  dofetilide Take 250 mcg by mouth 2 (two) times daily.   vitamin E 400 UNIT  capsule Take 400 Units by mouth daily.       If you experience worsening of your admission symptoms, develop shortness of breath, life threatening emergency, suicidal or homicidal thoughts you must seek medical attention immediately by calling 911 or calling your MD immediately  if symptoms less severe.  You Must read complete instructions/literature along with all the possible adverse reactions/side effects for all the Medicines you take and that have been prescribed to you. Take any new Medicines after you have completely understood and accept all the possible adverse reactions/side effects.   Please note  You were cared for by a hospitalist during your hospital stay. If you have any questions about your discharge medications or the care you received while you were in the hospital after you are discharged, you can call the unit and asked to speak with the hospitalist on call if the hospitalist that took care of you is not available. Once you are discharged, your primary care physician will handle any further medical issues. Please note that NO REFILLS for any discharge medications will be authorized once you are discharged, as it is imperative that you return to your primary care physician (or establish a relationship with a primary care physician if you do not have one) for your aftercare needs so that they can reassess your need for medications and monitor your lab values. Today   SUBJECTIVE   Doing well Feels so much better today  VITAL SIGNS:  Blood pressure (!) 122/55, pulse 72, temperature 98.5 F (36.9 C), temperature source Oral, resp. rate 16, height 5\' 2"  (1.575 m), weight 97.9 kg (215 lb 12.8 oz), SpO2 97 %.  I/O:    Intake/Output Summary (Last 24 hours) at 10/17/2017 1400 Last data filed at 10/17/2017 0845 Gross per 24 hour  Intake -  Output 1100 ml  Net -1100 ml    PHYSICAL EXAMINATION:  GENERAL:  57 y.o.-year-old patient lying in the bed with no acute distress.   EYES: Pupils equal, round, reactive to light and accommodation. No scleral icterus. Extraocular muscles intact.  HEENT: Head atraumatic, normocephalic. Oropharynx and nasopharynx clear.  NECK:  Supple, no jugular venous distention. No thyroid enlargement, no tenderness.  LUNGS: Normal breath sounds bilaterally, no wheezing, rales,rhonchi or crepitation. No use of accessory muscles of respiration.  CARDIOVASCULAR: S1, S2 normal. No murmurs, rubs, or gallops.  ABDOMEN: Soft, non-tender, non-distended. Bowel sounds present. No organomegaly or mass.  EXTREMITIES: No pedal edema, cyanosis, or clubbing.  NEUROLOGIC: Cranial nerves II through XII are intact. Muscle strength 5/5 in all extremities. Sensation intact. Gait not checked.  PSYCHIATRIC: The patient is alert and oriented x 3.  SKIN: No obvious rash, lesion, or ulcer.   DATA REVIEW:   CBC  Recent Labs  Lab 10/14/17 0102  WBC 5.2  HGB 14.3  HCT 41.9  PLT 159    Chemistries  Recent Labs  Lab 10/13/17 1305 10/14/17 0102  NA 131* 136  K 4.2 3.9  CL 95* 104  CO2 27 25  GLUCOSE 134* 112*  BUN 18 16  CREATININE 0.79 0.68  CALCIUM 8.5* 8.1*  MG  --  2.0  AST 30  --   ALT 23  --   ALKPHOS 51  --   BILITOT 1.2  --     Microbiology Results   Recent Results (from the past 240 hour(s))  Blood Culture (routine x 2)     Status: None (Preliminary result)   Collection Time: 10/13/17  1:26 PM  Result Value Ref Range Status   Specimen Description BLOOD LT HAND  Final   Special Requests   Final    BOTTLES DRAWN AEROBIC AND ANAEROBIC Blood Culture results may not be optimal due to an excessive volume of blood received in culture bottles   Culture   Final    NO GROWTH 4 DAYS Performed at [facility], [address] [place], [place] [address]    Report Status PENDING  Incomplete  Blood Culture (routine x 2)     Status: None (Preliminary result)   Collection Time: 10/13/17  1:26 PM  Result Value Ref Range Status    Specimen Description BLOOD RT [facility]  Final   Special Requests   Final    BOTTLES DRAWN AEROBIC AND ANAEROBIC Blood Culture adequate volume   Culture   Final    NO GROWTH 4 DAYS Performed at [facility], [address], [place], [place] [address]    Report Status PENDING  Incomplete    RADIOLOGY:  No results found.   Management plans discussed with the patient, family and they are in agreement.  CODE STATUS:     Code Status Orders  (From admission, onward)        Start     Ordered   10/13/17 1512  Full code  Continuous     10/13/17 1512    Code Status History    This patient has a current code status but no historical code status.    Advance Directive Documentation     Most Recent Value  Type of Advance Directive  Healthcare Power of Attorney, Living will  Pre-existing out of facility DNR order (yellow form or pink MOST form)  -  "MOST" Form in Place?  -      TOTAL TIME TAKING CARE OF THIS PATIENT: 40 minutes.    Fritzi Mandes M.D on 10/17/2017 at 2:00 PM  Between 7am to 6pm - Pager - [phone] After 6pm go to [email or web address] - password [email or web address] [facility]  Office  [phone]  CC: Primary care physician; Rusty Aus, MD

## 2017-10-17 NOTE — Progress Notes (Signed)
ANTICOAGULATION CONSULT NOTE - Initial Consult  Pharmacy Consult for apixaban Indication: atrial fibrillation  Allergies  Allergen Reactions  . Codeine Other (See Comments)    Hallucinations   . Erythromycin     initeracts with home med    Patient Measurements: Height: 5\' 2"  (157.5 cm) Weight: 215 lb 12.8 oz (97.9 kg) IBW/kg (Calculated) : 50.1  Vital Signs: Temp: 98.5 F (36.9 C) (04/30 0353) Temp Source: Oral (04/30 0353) BP: 122/55 (04/30 0748) Pulse Rate: 72 (04/30 0748)  Labs: Recent Labs    10/14/17 2220  TROPONINI <0.03    Estimated Creatinine Clearance: 83.7 mL/min (by C-G formula based on SCr of 0.68 mg/dL).   Medical History: Past Medical History:  Diagnosis Date  . Atrial fibrillation (HCC)     Medications:  Facility-Administered Medications Prior to Admission  Medication Dose Route Frequency Provider Last Rate Last Dose  . betamethasone acetate-betamethasone sodium phosphate (CELESTONE) injection 3 mg  3 mg Intramuscular Once Edrick Kins, DPM       Medications Prior to Admission  Medication Sig Dispense Refill Last Dose  . aspirin 325 MG tablet Take 325 mg by mouth once.    10/12/2017 at 2000  . Cyanocobalamin (CVS B-12) 1000 MCG TBCR Take 1 mcg by mouth daily.    10/12/2017 at 0800  . dofetilide (TIKOSYN) 250 MCG capsule Take 250 mcg by mouth 2 (two) times daily.    10/13/2017 at 0800  . furosemide (LASIX) 20 MG tablet TAKE ONE (1) TABLET EACH DAY   10/13/2017 at 0800  . magnesium oxide (MAG-OX) 400 MG tablet Take 400 mg by mouth daily.    10/12/2017 at 0800  . metoprolol succinate (TOPROL-XL) 25 MG 24 hr tablet Take 50 mg by mouth daily.    10/13/2017 at 0800  . metoprolol succinate (TOPROL-XL) 25 MG 24 hr tablet Take 25 mg by mouth at bedtime.   10/12/2017 at 2000  . Multiple Vitamin (MULTI-VITAMINS) TABS Take 1 tablet by mouth daily.    10/12/2017 at 0800  . omeprazole (PRILOSEC) 20 MG capsule Take 20 mg by mouth daily.    10/12/2017 at 0800  .  potassium chloride (K-DUR) 10 MEQ tablet TAKE ONE (1) TABLET EACH DAY   10/12/2017 at 0800  . simvastatin (ZOCOR) 40 MG tablet Take 40 mg by mouth daily at 6 PM.    10/12/2017 at 1800  . vitamin E 400 UNIT capsule Take 400 Units by mouth daily.    10/12/2017 at 0800   Scheduled:  . apixaban  5 mg Oral BID  . benzonatate  100 mg Oral TID  . dofetilide  250 mcg Oral BID  . doxycycline  100 mg Oral Q12H  . furosemide  20 mg Oral Daily  . guaiFENesin  10 mL Oral Q4H while awake  . magnesium oxide  400 mg Oral Daily  . metoprolol succinate  25 mg Oral QHS  . metoprolol succinate  50 mg Oral Daily  . multivitamin with minerals  1 tablet Oral Daily  . pantoprazole  40 mg Oral Daily  . potassium chloride  10 mEq Oral Daily  . simvastatin  40 mg Oral q1800  . sodium chloride flush  3 mL Intravenous Q12H  . vitamin B-12  1,000 mcg Oral Daily  . vitamin E  400 Units Oral Daily   Infusions:  . sodium chloride    . cefTRIAXone (ROCEPHIN)  IV 1 g (10/16/17 1723)   PRN: sodium chloride, acetaminophen **OR** acetaminophen, promethazine, senna-docusate, sodium  chloride flush Anti-infectives (From admission, onward)   Start     Dose/Rate Route Frequency Ordered Stop   10/15/17 1000  doxycycline (VIBRA-TABS) tablet 100 mg     100 mg Oral Every 12 hours 10/15/17 0820     10/14/17 1800  cefTRIAXone (ROCEPHIN) 1 g in sodium chloride 0.9 % 100 mL IVPB     1 g 200 mL/hr over 30 Minutes Intravenous Every 24 hours 10/14/17 0813     10/14/17 1800  doxycycline (VIBRAMYCIN) 100 mg in sodium chloride 0.9 % 250 mL IVPB  Status:  Discontinued     100 mg 125 mL/hr over 120 Minutes Intravenous Every 12 hours 10/14/17 0813 10/15/17 0820   10/13/17 1800  levofloxacin (LEVAQUIN) IVPB 750 mg  Status:  Discontinued     750 mg 100 mL/hr over 90 Minutes Intravenous Every 24 hours 10/13/17 1700 10/14/17 0813   10/13/17 1330  cefTRIAXone (ROCEPHIN) 1 g in sodium chloride 0.9 % 100 mL IVPB  Status:  Discontinued     1  g 200 mL/hr over 30 Minutes Intravenous  Once 10/13/17 1320 10/13/17 1324   10/13/17 1330  azithromycin (ZITHROMAX) 500 mg in sodium chloride 0.9 % 250 mL IVPB  Status:  Discontinued     500 mg 250 mL/hr over 60 Minutes Intravenous  Once 10/13/17 1320 10/13/17 1324   10/13/17 1330  piperacillin-tazobactam (ZOSYN) IVPB 3.375 g     3.375 g 100 mL/hr over 30 Minutes Intravenous  Once 10/13/17 1324 10/13/17 1405      Assessment: 57 year old female with afib requiring anticoagulation with apixaban, just received enoxaparin 1mg /kg subq. Will start apixaban 5mg  po bid at time due of next enoxaparin per best practice.    Goal of Therapy:  anticoagulation with apixaban  Monitor platelets by anticoagulation protocol: Yes   Plan:  Continue apixaban 5mg  po bid. Pharmacy to continue to monitor   [employer] [employer] 10/17/2017,8:39 AM

## 2017-10-17 NOTE — Discharge Instructions (Signed)
Syncope Syncope is when you temporarily lose consciousness. Syncope may also be called fainting or passing out. It is caused by a sudden decrease in blood flow to the brain. Even though most causes of syncope are not dangerous, syncope can be a sign of a serious medical problem. Signs that you may be about to faint include:  Feeling dizzy or light-headed.  Feeling nauseous.  Seeing all white or all black in your field of vision.  Having cold, clammy skin.  If you fainted, get medical help right away.Call your local emergency services (911 in the [place] Do not drive yourself to the hospital. Follow these instructions at home: Pay attention to any changes in your symptoms. Take these actions to help with your condition:  Have someone stay with you until you feel stable.  Do not drive, use machinery, or play sports until your health care provider says it is okay.  Keep all follow-up visits as told by your health care provider. This is important.  If you start to feel like you might faint, lie down right away and raise (elevate) your feet above the level of your heart. Breathe deeply and steadily. Wait until all of the symptoms have passed.  Drink enough fluid to keep your urine clear or pale yellow.  If you are taking blood pressure or heart medicine, get up slowly and take several minutes to sit and then stand. This can reduce dizziness.  Take over-the-counter and prescription medicines only as told by your health care provider.  Get help right away if:  You have a severe headache.  You have unusual pain in your chest, abdomen, or back.  You are bleeding from your mouth or rectum, or you have black or tarry stool.  You have a very fast or irregular heartbeat (palpitations).  You have pain with breathing.  You faint once or repeatedly.  You have a seizure.  You are confused.  You have trouble walking.  You have severe weakness.  You have vision problems. These  symptoms may represent a serious problem that is an emergency. Do not wait to see if your symptoms will go away. Get medical help right away. Call your local emergency services (911 in the [place] Do not drive yourself to the hospital. This information is not intended to replace advice given to you by your health care provider. Make sure you discuss any questions you have with your health care provider. Document Released: 06/06/2005 Document Revised: 11/12/2015 Document Reviewed: 02/18/2015 Elsevier Interactive Patient Education  2018 [employer]  

## 2017-10-17 NOTE — Progress Notes (Signed)
Patient re-walked off of supplemental oxygen, pulse ox saturation now never below 91%. Will inform physician of progress. Wenda [facility]

## 2017-10-18 LAB — CULTURE, BLOOD (ROUTINE X 2)
CULTURE: NO GROWTH
Culture: NO GROWTH
Special Requests: ADEQUATE

## 2017-10-20 ENCOUNTER — Telehealth: Payer: Self-pay

## 2017-10-20 NOTE — Telephone Encounter (Signed)
Flagged on EMMI report for not reading discharge papers.  Called and spoke with patient, who reports she answered the question wrong.  She has read over her papers and has no issues or concerns regarding her discharge at this time.  I thanked her for her time and informed her she would receive one more automated call in the next few days checking on her again.

## 2017-11-17 ENCOUNTER — Other Ambulatory Visit: Payer: Self-pay | Admitting: Internal Medicine

## 2017-11-17 DIAGNOSIS — Z1231 Encounter for screening mammogram for malignant neoplasm of breast: Secondary | ICD-10-CM

## 2017-12-06 ENCOUNTER — Ambulatory Visit
Admission: RE | Admit: 2017-12-06 | Discharge: 2017-12-06 | Disposition: A | Discharge status: Home / Self Care | Payer: BLUE CROSS/BLUE SHIELD | Source: Ambulatory Visit | Attending: Internal Medicine | Admitting: Internal Medicine

## 2017-12-06 DIAGNOSIS — Z1231 Encounter for screening mammogram for malignant neoplasm of breast: Secondary | ICD-10-CM | POA: Insufficient documentation

## 2019-01-23 ENCOUNTER — Other Ambulatory Visit: Payer: Self-pay | Admitting: Internal Medicine

## 2019-01-23 DIAGNOSIS — Z1231 Encounter for screening mammogram for malignant neoplasm of breast: Secondary | ICD-10-CM

## 2019-01-28 ENCOUNTER — Encounter (INDEPENDENT_AMBULATORY_CARE_PROVIDER_SITE_OTHER): Payer: Self-pay

## 2019-01-28 ENCOUNTER — Ambulatory Visit (INDEPENDENT_AMBULATORY_CARE_PROVIDER_SITE_OTHER): Payer: BC Managed Care – PPO | Admitting: Vascular Surgery

## 2019-01-28 ENCOUNTER — Other Ambulatory Visit: Payer: Self-pay

## 2019-01-28 ENCOUNTER — Encounter (INDEPENDENT_AMBULATORY_CARE_PROVIDER_SITE_OTHER): Payer: Self-pay | Admitting: Vascular Surgery

## 2019-01-28 DIAGNOSIS — I8311 Varicose veins of right lower extremity with inflammation: Secondary | ICD-10-CM

## 2019-01-28 DIAGNOSIS — K219 Gastro-esophageal reflux disease without esophagitis: Secondary | ICD-10-CM

## 2019-01-28 DIAGNOSIS — I872 Venous insufficiency (chronic) (peripheral): Secondary | ICD-10-CM

## 2019-01-28 DIAGNOSIS — I8312 Varicose veins of left lower extremity with inflammation: Secondary | ICD-10-CM

## 2019-01-28 DIAGNOSIS — I10 Essential (primary) hypertension: Secondary | ICD-10-CM

## 2019-01-28 DIAGNOSIS — E782 Mixed hyperlipidemia: Secondary | ICD-10-CM | POA: Diagnosis not present

## 2019-01-28 NOTE — Progress Notes (Signed)
MRN : [id number]  Barbara Johnston is a 58 y.o. (07-26-1960) female who presents with chief complaint of  Chief Complaint  Patient presents with   New Patient (Initial Visit)  .  History of Present Illness:   The patient is seen for evaluation of symptomatic varicose veins. The patient relates burning and stinging which worsened steadily throughout the course of the day, particularly with standing.   Recently she experienced an episode of hemorrhage from a cluster of veins located in the anterior left shin.  Bleeding is quite dramatic and she had to call 911.  When the paramedics came they elevated her leg and placed a tourniquet over the bleeding site and this achieved hemostasis.  This is the first time she has had bleeding and she has not had any recurrence.  However, it certainly was very traumatic for her and she is now seeking treatment of her varicose veins to prevent further episodes.  The patient also notes an aching and throbbing pain over the varicosities, particularly with prolonged dependent positions. The symptoms are significantly improved with elevation.  The patient also notes that during hot weather the symptoms are greatly intensified. The patient states the pain from the varicose veins interferes with work, daily exercise, shopping and household maintenance. At this point, the symptoms are persistent and severe enough that they're having a negative impact on lifestyle and are interfering with daily activities.  There is no history of DVT, PE or superficial thrombophlebitis. There is no history of ulceration. The patient denies a significant family history of varicose veins.  The patient has intermittently worn graduated compression in the past. At the present time the patient has not been using over-the-counter analgesics. There is no history of prior surgical intervention such as laser ablation or traditional stripping.  She has had some sclerotherapy in the remote  past.    Current Meds  Medication Sig   apixaban (ELIQUIS) 5 MG TABS tablet Take 1 tablet (5 mg total) by mouth 2 (two) times daily.   dofetilide (TIKOSYN) 250 MCG capsule Take 250 mcg by mouth 2 (two) times daily.    furosemide (LASIX) 20 MG tablet TAKE ONE (1) TABLET EACH DAY   metoprolol succinate (TOPROL-XL) 25 MG 24 hr tablet Take 50 mg by mouth daily.    Multiple Vitamin (MULTI-VITAMINS) TABS Take 1 tablet by mouth daily.    omeprazole (PRILOSEC) 20 MG capsule Take 20 mg by mouth daily.    potassium chloride (K-DUR) 10 MEQ tablet TAKE ONE (1) TABLET EACH DAY   vitamin E 400 UNIT capsule Take 400 Units by mouth daily.     Past Medical History:  Diagnosis Date   Atrial fibrillation (HCC)    Gastritis    Gastrointestinal ulcer    GERD (gastroesophageal reflux disease)    Hyperlipidemia    Hypertension    Obesity     Past Surgical History:  Procedure Laterality Date   ABDOMINAL HYSTERECTOMY     CARDIAC ELECTROPHYSIOLOGY MAPPING AND ABLATION     CHOLECYSTECTOMY     OOPHORECTOMY      Social History Social History   Tobacco Use   Smoking status: Never Smoker   Smokeless tobacco: Never Used  Substance Use Topics   Alcohol use: Yes   Drug use: Never    Family History Family History  Problem Relation Age of Onset   Heart disease Father    Heart disease Brother    Breast cancer Neg Hx   No family  history of bleeding/clotting disorders, porphyria or autoimmune disease   Allergies  Allergen Reactions   Codeine Other (See Comments)    Hallucinations    Erythromycin     initeracts with home med     REVIEW OF SYSTEMS (Negative unless checked)  Constitutional: [] Weight loss  [] Fever  [] Chills Cardiac: [] Chest pain   [] Chest pressure   [] Palpitations   [] Shortness of breath when laying flat   [] Shortness of breath with exertion. Vascular:  [] Pain in legs with walking   [x] Pain in legs at rest  [] History of DVT   [] Phlebitis    [] Swelling in legs   [x] Varicose veins   [] Non-healing ulcers Pulmonary:   [] Uses home oxygen   [] Productive cough   [] Hemoptysis   [] Wheeze  [] COPD   [] Asthma Neurologic:  [] Dizziness   [] Seizures   [] History of stroke   [] History of TIA  [] Aphasia   [] Vissual changes   [] Weakness or numbness in arm   [] Weakness or numbness in leg Musculoskeletal:   [] Joint swelling   [] Joint pain   [] Low back pain Hematologic:  [] Easy bruising  [] Easy bleeding   [] Hypercoagulable state   [] Anemic Gastrointestinal:  [] Diarrhea   [] Vomiting  [x] Gastroesophageal reflux/heartburn   [] Difficulty swallowing. Genitourinary:  [] Chronic kidney disease   [] Difficult urination  [] Frequent urination   [] Blood in urine Skin:  [] Rashes   [] Ulcers  Psychological:  [] History of anxiety   []  History of major depression.  Physical Examination  Vitals:   01/28/19 1557  BP: 135/83  Pulse: 76  Resp: 12  Weight: 221 lb (100.2 kg)  Height: 5\' 2"  (1.575 m)   Body mass index is 40.42 kg/m. Gen: WD/WN, NAD Head: [place]/AT, No temporalis wasting.  Ear/Nose/Throat: Hearing grossly intact, nares w/o erythema or drainage, poor dentition Eyes: PER, EOMI, sclera nonicteric.  Neck: Supple, no masses.  No bruit or JVD.  Pulmonary:  Good air movement, clear to auscultation bilaterally, no use of accessory muscles.  Cardiac: RRR, normal S1, S2, no Murmurs. Vascular: scattered varicosities present bilaterally, some >5 mm left more affected than the right.  Mild venous stasis changes to the legs bilaterally.  trace soft pitting edema. Vessel Right Left  PT Palpable Palpable  DP Palpable Palpable  Gastrointestinal: soft, non-distended. No guarding/no peritoneal signs.  Musculoskeletal: M/S 5/5 throughout.  No deformity or atrophy.  Neurologic: CN 2-12 intact. Pain and light touch intact in extremities.  Symmetrical.  Speech is fluent. Motor exam as listed above. Psychiatric: Judgment intact, Mood & affect appropriate for pt's clinical  situation. Dermatologic: mild venous rashes no ulcers noted.  No changes consistent with cellulitis. Lymph : No Cervical lymphadenopathy, no lichenification or skin changes of chronic lymphedema.  CBC Lab Results  Component Value Date   WBC 5.2 10/14/2017   HGB 14.3 10/14/2017   HCT 41.9 10/14/2017   MCV 89.2 10/14/2017   PLT 159 10/14/2017    BMET    Component Value Date/Time   NA 136 10/14/2017 0102   K 3.9 10/14/2017 0102   CL 104 10/14/2017 0102   CO2 25 10/14/2017 0102   GLUCOSE 112 (H) 10/14/2017 0102   BUN 16 10/14/2017 0102   CREATININE 0.68 10/14/2017 0102   CALCIUM 8.1 (L) 10/14/2017 0102   GFRNONAA >60 10/14/2017 0102   GFRAA >60 10/14/2017 0102   CrCl cannot be calculated (Patient's most recent lab result is older than the maximum 21 days allowed.).  COAG No results found for: INR, PROTIME  Radiology No results found.  Assessment/Plan 1. Varicose veins of both lower extremities with inflammation  Recommend:  The patient has symptomatic varicose veins that are painful and associated with swelling.  In addition the patient has now had an episode of hemorrhage from her varicose veins indicating that a more aggressive treatment is appropriate.  I have had a long discussion with the patient regarding  varicose veins and why they cause symptoms.  Patient will begin wearing graduated compression stockings class 1 on a daily basis, beginning first thing in the morning and removing them in the evening. The patient is instructed specifically not to sleep in the stockings.    The patient  will also begin using over-the-counter analgesics such as Motrin 600 mg po TID to help control the symptoms.    In addition, behavioral modification including elevation during the day will be initiated.    Pending the results of these changes the  patient will be reevaluated in three months.   An  ultrasound of the venous system will be obtained.   Further plans will be based on  the ultrasound results and whether conservative therapies are successful at eliminating the pain and swelling.  - VAS [place] LOWER EXTREMITY VENOUS REFLUX; Future  2. Chronic venous insufficiency No surgery or intervention at this point in time.    I have had a long discussion with the patient regarding venous insufficiency and why it  causes symptoms. I have discussed with the patient the chronic skin changes that accompany venous insufficiency and the long term sequela such as infection and ulceration.  Patient will begin wearing graduated compression stockings class 1 (20-30 mmHg) or compression wraps on a daily basis a prescription was given. The patient will put the stockings on first thing in the morning and removing them in the evening. The patient is instructed specifically not to sleep in the stockings.    In addition, behavioral modification including several periods of elevation of the lower extremities during the day will be continued. I have demonstrated that proper elevation is a position with the ankles at heart level.  The patient is instructed to begin routine exercise, especially walking on a daily basis  Patient should undergo duplex ultrasound of the venous system to ensure that DVT or reflux is not present.   3. Mixed hyperlipidemia Continue statin as ordered and reviewed, no changes at this time   4. Essential hypertension Continue antihypertensive medications as already ordered, these medications have been reviewed and there are no changes at this time.   5. Gastroesophageal reflux disease without esophagitis Continue PPI as already ordered, this medication has been reviewed and there are no changes at this time.  Avoidence of caffeine and alcohol  Moderate elevation of the head of the bed     Hortencia Pilar, MD  01/28/2019 5:16 PM

## 2019-01-30 DIAGNOSIS — I872 Venous insufficiency (chronic) (peripheral): Secondary | ICD-10-CM | POA: Insufficient documentation

## 2019-01-30 DIAGNOSIS — E785 Hyperlipidemia, unspecified: Secondary | ICD-10-CM | POA: Insufficient documentation

## 2019-01-30 DIAGNOSIS — K219 Gastro-esophageal reflux disease without esophagitis: Secondary | ICD-10-CM | POA: Insufficient documentation

## 2019-01-30 DIAGNOSIS — I10 Essential (primary) hypertension: Secondary | ICD-10-CM | POA: Insufficient documentation

## 2019-01-30 DIAGNOSIS — I8312 Varicose veins of left lower extremity with inflammation: Secondary | ICD-10-CM | POA: Insufficient documentation

## 2019-01-30 DIAGNOSIS — I8311 Varicose veins of right lower extremity with inflammation: Secondary | ICD-10-CM | POA: Insufficient documentation

## 2019-02-01 ENCOUNTER — Encounter (INDEPENDENT_AMBULATORY_CARE_PROVIDER_SITE_OTHER): Payer: BC Managed Care – PPO

## 2019-02-01 ENCOUNTER — Ambulatory Visit (INDEPENDENT_AMBULATORY_CARE_PROVIDER_SITE_OTHER): Payer: BC Managed Care – PPO | Admitting: Nurse Practitioner

## 2019-02-11 ENCOUNTER — Ambulatory Visit (INDEPENDENT_AMBULATORY_CARE_PROVIDER_SITE_OTHER): Payer: BC Managed Care – PPO | Admitting: Nurse Practitioner

## 2019-02-11 ENCOUNTER — Encounter (INDEPENDENT_AMBULATORY_CARE_PROVIDER_SITE_OTHER): Payer: BC Managed Care – PPO

## 2019-03-04 ENCOUNTER — Ambulatory Visit
Admission: RE | Admit: 2019-03-04 | Discharge: 2019-03-04 | Disposition: A | Discharge status: Home / Self Care | Payer: BC Managed Care – PPO | Source: Ambulatory Visit | Attending: Internal Medicine | Admitting: Internal Medicine

## 2019-03-04 DIAGNOSIS — Z1231 Encounter for screening mammogram for malignant neoplasm of breast: Secondary | ICD-10-CM | POA: Diagnosis present

## 2019-04-05 ENCOUNTER — Ambulatory Visit: Payer: BC Managed Care – PPO | Admitting: Podiatry

## 2019-04-18 DIAGNOSIS — I4891 Unspecified atrial fibrillation: Secondary | ICD-10-CM | POA: Insufficient documentation

## 2019-04-19 ENCOUNTER — Other Ambulatory Visit: Payer: Self-pay

## 2019-04-19 ENCOUNTER — Ambulatory Visit (INDEPENDENT_AMBULATORY_CARE_PROVIDER_SITE_OTHER): Payer: BC Managed Care – PPO

## 2019-04-19 ENCOUNTER — Ambulatory Visit: Payer: BC Managed Care – PPO | Admitting: Podiatry

## 2019-04-19 DIAGNOSIS — M2012 Hallux valgus (acquired), left foot: Secondary | ICD-10-CM

## 2019-04-19 DIAGNOSIS — M7752 Other enthesopathy of left foot: Secondary | ICD-10-CM

## 2019-04-19 MED ORDER — METHYLPREDNISOLONE 4 MG PO TBPK: ORAL_TABLET | ORAL | 0 refills | Status: DC

## 2019-04-23 NOTE — Progress Notes (Signed)
   Subjective: 58 y.o. female presents today with a chief complaint of aching, burning and sharp pain of the left foot secondary to a bunion that has been present for the past 2-3 months. Touching the area and wearing certain shoes increases the pain. She has been applying [place]'s ointment for treatment. Patient is here for further evaluation and treatment.   Past Medical History:  Diagnosis Date  . Atrial fibrillation ([place])   . Gastritis   . Gastrointestinal ulcer   . GERD (gastroesophageal reflux disease)   . Hyperlipidemia   . Hypertension   . Obesity       Objective: Physical Exam General: The patient is alert and oriented x3 in no acute distress.  Dermatology: Skin is cool, dry and supple bilateral lower extremities. Negative for open lesions or macerations.  Vascular: Palpable pedal pulses bilaterally. No edema or erythema noted. Capillary refill within normal limits.  Neurological: Epicritic and protective threshold grossly intact bilaterally.   Musculoskeletal Exam: Clinical evidence of bunion deformity noted to the respective foot. There is moderate pain on palpation range of motion of the first MPJ. Lateral deviation of the hallux noted consistent with hallux abductovalgus.  Radiographic Exam: Increased intermetatarsal angle greater than 15 with a hallux abductus angle greater than 30 noted on AP view. Moderate degenerative changes noted within the first MPJ.  Assessment: 1. HAV w/ bunion deformity left 2. 1st MPJ capsulitis left  3. H/o atrial fibrillation - on Eliquis and Tikosyn     Plan of Care:  1. Patient was evaluated. X-Rays reviewed. 2. Injection of 0.5 mLs [place] Soluspan injected into the 1st MPJ of the left foot.  3. Recommended OTC bunion pad.  4. Prescription for Medrol Dose Pak provided to patient. 5. Recommended wide fitting shoes.  6. Wants to avoid surgery if possible.  7. Return to clinic as needed.    Avid golfer.      Edrick Kins, DPM [facility]  Dr. Edrick Kins, [address]                                        [place], [place] [address]                Office [phone]  Fax [phone]

## 2019-07-23 ENCOUNTER — Ambulatory Visit: Payer: BC Managed Care – PPO | Admitting: Podiatry

## 2020-01-17 ENCOUNTER — Other Ambulatory Visit
Admission: RE | Admit: 2020-01-17 | Discharge: 2020-01-17 | Disposition: A | Discharge status: Home / Self Care | Payer: BC Managed Care – PPO | Source: Ambulatory Visit | Attending: Internal Medicine | Admitting: Internal Medicine

## 2020-01-17 ENCOUNTER — Other Ambulatory Visit: Payer: Self-pay

## 2020-01-17 DIAGNOSIS — Z20822 Contact with and (suspected) exposure to covid-19: Secondary | ICD-10-CM | POA: Insufficient documentation

## 2020-01-17 DIAGNOSIS — Z01812 Encounter for preprocedural laboratory examination: Secondary | ICD-10-CM | POA: Insufficient documentation

## 2020-01-17 LAB — SARS CORONAVIRUS 2 (TAT 6-24 HRS): SARS Coronavirus 2: NEGATIVE

## 2020-01-21 ENCOUNTER — Emergency Department: Payer: BC Managed Care – PPO

## 2020-01-21 ENCOUNTER — Emergency Department
Admission: EM | Admit: 2020-01-21 | Discharge: 2020-01-21 | Disposition: A | Discharge status: Home / Self Care | Payer: BC Managed Care – PPO | Source: Home / Self Care | Attending: Emergency Medicine | Admitting: Emergency Medicine

## 2020-01-21 ENCOUNTER — Other Ambulatory Visit: Payer: Self-pay

## 2020-01-21 ENCOUNTER — Encounter: Payer: Self-pay | Admitting: Internal Medicine

## 2020-01-21 ENCOUNTER — Ambulatory Visit: Payer: BC Managed Care – PPO | Admitting: Anesthesiology

## 2020-01-21 ENCOUNTER — Encounter
Admission: RE | Disposition: A | Discharge status: Home / Self Care | Payer: Self-pay | Source: Home / Self Care | Attending: Internal Medicine

## 2020-01-21 ENCOUNTER — Ambulatory Visit
Admission: RE | Admit: 2020-01-21 | Discharge: 2020-01-21 | Disposition: A | Discharge status: Home / Self Care | Payer: BC Managed Care – PPO | Attending: Internal Medicine | Admitting: Internal Medicine

## 2020-01-21 DIAGNOSIS — K219 Gastro-esophageal reflux disease without esophagitis: Secondary | ICD-10-CM | POA: Diagnosis not present

## 2020-01-21 DIAGNOSIS — Z79899 Other long term (current) drug therapy: Secondary | ICD-10-CM | POA: Insufficient documentation

## 2020-01-21 DIAGNOSIS — I10 Essential (primary) hypertension: Secondary | ICD-10-CM | POA: Insufficient documentation

## 2020-01-21 DIAGNOSIS — Z6832 Body mass index (BMI) 32.0-32.9, adult: Secondary | ICD-10-CM | POA: Diagnosis not present

## 2020-01-21 DIAGNOSIS — I480 Paroxysmal atrial fibrillation: Secondary | ICD-10-CM | POA: Diagnosis present

## 2020-01-21 DIAGNOSIS — E559 Vitamin D deficiency, unspecified: Secondary | ICD-10-CM | POA: Diagnosis not present

## 2020-01-21 DIAGNOSIS — R55 Syncope and collapse: Secondary | ICD-10-CM

## 2020-01-21 DIAGNOSIS — E669 Obesity, unspecified: Secondary | ICD-10-CM | POA: Diagnosis not present

## 2020-01-21 DIAGNOSIS — Z7901 Long term (current) use of anticoagulants: Secondary | ICD-10-CM | POA: Insufficient documentation

## 2020-01-21 DIAGNOSIS — R112 Nausea with vomiting, unspecified: Secondary | ICD-10-CM | POA: Insufficient documentation

## 2020-01-21 DIAGNOSIS — E782 Mixed hyperlipidemia: Secondary | ICD-10-CM | POA: Diagnosis not present

## 2020-01-21 HISTORY — PX: CARDIOVERSION: SHX1299

## 2020-01-21 LAB — CBC
HCT: 47.1 % — ABNORMAL HIGH (ref 36.0–46.0)
Hemoglobin: 15.8 g/dL — ABNORMAL HIGH (ref 12.0–15.0)
MCH: 30.8 pg (ref 26.0–34.0)
MCHC: 33.5 g/dL (ref 30.0–36.0)
MCV: 91.8 fL (ref 80.0–100.0)
Platelets: 220 10*3/uL (ref 150–400)
RBC: 5.13 MIL/uL — ABNORMAL HIGH (ref 3.87–5.11)
RDW: 12.1 % (ref 11.5–15.5)
WBC: 7.2 10*3/uL (ref 4.0–10.5)
nRBC: 0 % (ref 0.0–0.2)

## 2020-01-21 LAB — PROTIME-INR
INR: 1.2 (ref 0.8–1.2)
Prothrombin Time: 15.1 seconds (ref 11.4–15.2)

## 2020-01-21 LAB — MAGNESIUM: Magnesium: 2.4 mg/dL (ref 1.7–2.4)

## 2020-01-21 LAB — TSH: TSH: 4.431 u[IU]/mL (ref 0.350–4.500)

## 2020-01-21 LAB — BASIC METABOLIC PANEL
Anion gap: 10 (ref 5–15)
BUN: 26 mg/dL — ABNORMAL HIGH (ref 6–20)
CO2: 28 mmol/L (ref 22–32)
Calcium: 9.3 mg/dL (ref 8.9–10.3)
Chloride: 100 mmol/L (ref 98–111)
Creatinine, Ser: 0.96 mg/dL (ref 0.44–1.00)
GFR calc Af Amer: >60 mL/min (ref >60)
GFR calc non Af Amer: >60 mL/min (ref >60)
Glucose, Bld: 158 mg/dL — ABNORMAL HIGH (ref 70–99)
Potassium: 4.7 mmol/L (ref 3.5–5.1)
Sodium: 138 mmol/L (ref 135–145)

## 2020-01-21 LAB — TROPONIN I (HIGH SENSITIVITY)
Troponin I (High Sensitivity): 3 ng/L (ref <18)
Troponin I (High Sensitivity): <2 ng/L (ref <18)

## 2020-01-21 SURGERY — CARDIOVERSION
Anesthesia: General

## 2020-01-21 MED ORDER — ONDANSETRON HCL 4 MG PO TABS: 4.0000 mg | ORAL_TABLET | Freq: Three times a day (TID) | ORAL | 0 refills | Status: DC | PRN

## 2020-01-21 MED ORDER — SODIUM CHLORIDE 0.9% FLUSH: 3.0000 mL | Freq: Once | INTRAVENOUS | Status: DC

## 2020-01-21 MED ORDER — ONDANSETRON 4 MG PO TBDP
4.0000 mg | ORAL_TABLET | Freq: Once | ORAL | Status: AC
  Administered 2020-01-21: 4 mg via ORAL
  Filled 2020-01-21: qty 1

## 2020-01-21 MED ORDER — SODIUM CHLORIDE (PF) 0.9 % IJ SOLN
INTRAMUSCULAR | Status: AC
  Filled 2020-01-21: qty 10

## 2020-01-21 MED ORDER — PHENYLEPHRINE HCL (PRESSORS) 10 MG/ML IV SOLN
INTRAVENOUS | Status: AC
  Filled 2020-01-21: qty 1

## 2020-01-21 MED ORDER — METOPROLOL SUCCINATE ER 50 MG PO TB24: 50.0000 mg | ORAL_TABLET | Freq: Every day | ORAL | 4 refills | Status: AC

## 2020-01-21 MED ORDER — SODIUM CHLORIDE 0.9 % IV SOLN: INTRAVENOUS | Status: DC

## 2020-01-21 MED ORDER — PROPOFOL 10 MG/ML IV BOLUS
INTRAVENOUS | Status: DC | PRN
  Administered 2020-01-21: 50 mg via INTRAVENOUS
  Administered 2020-01-21: 20 mg via INTRAVENOUS

## 2020-01-21 MED ORDER — PROPOFOL 10 MG/ML IV BOLUS
INTRAVENOUS | Status: AC
  Filled 2020-01-21: qty 20

## 2020-01-21 MED ORDER — ONDANSETRON HCL 4 MG/2ML IJ SOLN: 4.0000 mg | Freq: Once | INTRAMUSCULAR | Status: DC | PRN

## 2020-01-21 NOTE — CV Procedure (Signed)
Electrical Cardioversion Procedure Note Barbara Johnston [id number] May 19, 1961  Procedure: Electrical Cardioversion Indications:  Paroxysmal non valvular atrial fibrillation  Procedure Details Consent: Risks of procedure as well as the alternatives and risks of each were explained to the (patient/caregiver).  Consent for procedure obtained. Time Out: Verified patient identification, verified procedure, site/side was marked, verified correct patient position, special equipment/implants available, medications/allergies/relevent history reviewed, required imaging and test results available.  Performed  Patient placed on cardiac monitor, pulse oximetry, supplemental oxygen as necessary.  Sedation given: Propofol and versed as per anesthesia  Pacer pads placed anterior and posterior chest.  Cardioverted 1 time(s).  Cardioverted at 120J.  Evaluation Findings: Post procedure EKG shows: NSR Complications: None Patient did  tolerate procedure well.   Barbara Johnston M.D. [facility] 01/21/2020, 7:40 AM

## 2020-01-21 NOTE — Anesthesia Preprocedure Evaluation (Signed)
Anesthesia Evaluation  Patient identified by MRN, date of birth, ID band Patient awake    Reviewed: Allergy & Precautions, NPO status , Patient's Chart, lab work & pertinent test results  History of Anesthesia Complications Negative for: history of anesthetic complications  Airway Mallampati: III  TM Distance: <3 FB Neck ROM: Full    Dental no notable dental hx. (+) Teeth Intact   Pulmonary neg pulmonary ROS, neg sleep apnea, neg COPD, Patient abstained from smoking.Not current smoker,    Pulmonary exam normal breath sounds clear to auscultation       Cardiovascular Exercise Tolerance: Good METShypertension, (-) CAD and (-) Past MI + dysrhythmias Atrial Fibrillation  Rhythm:Irregular Rate:Tachycardia - Systolic murmurs    Neuro/Psych negative neurological ROS  negative psych ROS   GI/Hepatic GERD  Controlled,(+)     (-) substance abuse  ,   Endo/Other  neg diabetes  Renal/GU negative Renal ROS     Musculoskeletal   Abdominal   Peds  Hematology   Anesthesia Other Findings Past Medical History: No date: Atrial fibrillation (HCC) No date: Gastritis No date: Gastrointestinal ulcer No date: GERD (gastroesophageal reflux disease) No date: Hyperlipidemia No date: Hypertension No date: Obesity  Reproductive/Obstetrics                             Anesthesia Physical Anesthesia Plan  ASA: II  Anesthesia Plan: General   Post-op Pain Management:    Induction: Intravenous  PONV Risk Score and Plan: 3 and Ondansetron, Propofol infusion and TIVA  Airway Management Planned: Nasal Cannula  Additional Equipment: None  Intra-op Plan:   Post-operative Plan:   Informed Consent: I have reviewed the patients History and Physical, chart, labs and discussed the procedure including the risks, benefits and alternatives for the proposed anesthesia with the patient or authorized representative  who has indicated his/her understanding and acceptance.     Dental advisory given  Plan Discussed with: CRNA and Surgeon  Anesthesia Plan Comments: (Discussed risks of anesthesia with patient, including possibility of difficulty with spontaneous ventilation under anesthesia necessitating airway intervention, PONV, and rare risks such as cardiac or respiratory or neurological events. Patient understands.)        Anesthesia Quick Evaluation

## 2020-01-21 NOTE — Consult Note (Signed)
Cardiology Consultation Note    Patient ID: Barbara Johnston, MRN: [id number], DOB/AGE: 22-Sep-1960 59 y.o. Admit date: 01/21/2020   Date of Consult: 01/21/2020 Primary Physician: Rusty Aus, MD Primary Cardiologist: Dr. Nehemiah Massed  Chief Complaint: syncope Reason for Consultation: syncope Requesting MD: Dr. Tamala Julian  HPI: Barbara Johnston is a 59 y.o. female with history of atrial fibrillation who underwent elective cardioversion this morning.  She was on flecainide metoprolol and Cardizem.  She took all her drugs this morning along with her anticoagulation.  She converted back to sinus rhythm.  She was discharged to home.  She developed nausea and had an apparent syncopal episode lasting 30 seconds witnessed by her husband.  She was told to go to the ER.  In the emergency room she was in sinus rhythm.  She was hemodynamically stable.  She was given IV fluids.  She had a brief episode of nausea treated with Zofran.  There were no pauses.  There were no episodes of atrial fibrillation.  She is hemodynamically stable and able to ambulate  Past Medical History:  Diagnosis Date  . Atrial fibrillation ([place])   . Gastritis   . Gastrointestinal ulcer   . GERD (gastroesophageal reflux disease)   . Hyperlipidemia   . Hypertension   . Obesity       Surgical History:  Past Surgical History:  Procedure Laterality Date  . ABDOMINAL HYSTERECTOMY    . CARDIAC ELECTROPHYSIOLOGY MAPPING AND ABLATION    . CHOLECYSTECTOMY    . OOPHORECTOMY       Home Meds: Prior to Admission medications   Medication Sig Start Date End Date Taking? Authorizing Provider  acetaminophen (TYLENOL) 500 MG tablet Take 500 mg by mouth every 6 (six) hours as needed for moderate pain or headache.    [provider]  apixaban (ELIQUIS) 5 MG TABS tablet Take 1 tablet (5 mg total) by mouth 2 (two) times daily. 10/17/17   Fritzi Mandes, MD  atorvastatin (LIPITOR) 20 MG tablet Take 20 mg by mouth daily.  01/24/19   [provider]  BLACK COHOSH PO Take 2 tablets by mouth daily. Menosense    [provider]  Calcium Carb-Cholecalciferol (CALCIUM 600 + D PO) Take 1 tablet by mouth daily.    [provider]  flecainide (TAMBOCOR) 100 MG tablet Take 100 mg by mouth 2 (two) times daily. Starting on Friday night, was asked to take 150mg  twice daily by cardiology    [provider]  furosemide (LASIX) 20 MG tablet Take 20 mg by mouth daily.  08/12/15   [provider]  L-Lysine HCl 500 MG TABS Take 500 mg by mouth daily.     [provider]  magnesium oxide (MAG-OX) 400 MG tablet Take 800 mg by mouth daily.     [provider]  metoprolol succinate (TOPROL-XL) 50 MG 24 hr tablet Take 1 tablet (50 mg total) by mouth daily. Take with or immediately following a meal. 01/21/20   Corey Skains, MD  Multiple Vitamin (MULTI-VITAMINS) TABS Take 1 tablet by mouth daily.     [provider]  omeprazole (PRILOSEC) 20 MG capsule Take 20 mg by mouth daily.     [provider]  ondansetron (ZOFRAN) 4 MG tablet Take 1 tablet (4 mg total) by mouth every 8 (eight) hours as needed for up to 10 doses for nausea or vomiting. 01/21/20   Lucrezia Starch, MD  potassium chloride (K-DUR) 10 MEQ tablet  Take 10 mEq by mouth daily.  08/20/15   [provider]  vitamin E 400 UNIT capsule Take 400 Units by mouth daily.     [provider]    Inpatient Medications:  . sodium chloride flush  3 mL Intravenous Once   . sodium chloride Stopped (01/21/20 1352)    Allergies:  Allergies  Allergen Reactions  . Codeine Other (See Comments)    Hallucinations   . Erythromycin     initeracts with home med    Social History   Socioeconomic History  . Marital status: Married    Spouse name: Not on file  . Number of children: Not on file  . Years of education: Not on file  . Highest education level: Not on file  Occupational History    Employer: [employer]  Tobacco Use  . Smoking status: Never Smoker  . Smokeless tobacco: Never Used  Vaping Use  . Vaping Use: Never used  Substance and Sexual Activity  . Alcohol use: Yes  . Drug use: Never  . Sexual activity: Not on file  Other Topics Concern  . Not on file  Social History Narrative  . Not on file   Social Determinants of Health   Financial Resource Strain:   . Difficulty of Paying Living Expenses:   Food Insecurity:   . Worried About Charity fundraiser in the Last Year:   . Arboriculturist in the Last Year:   Transportation Needs:   . Film/video editor (Medical):   Marland Kitchen Lack of Transportation (Non-Medical):   Physical Activity:   . Days of Exercise per Week:   . Minutes of Exercise per Session:   Stress:   . Feeling of Stress :   Social Connections:   . Frequency of Communication with Friends and Family:   . Frequency of Social Gatherings with Friends and Family:   . Attends Religious Services:   . Active Member of Clubs or Organizations:   . Attends Archivist Meetings:   Marland Kitchen Marital Status:   Intimate Partner Violence:   . Fear of Current or Ex-Partner:   . Emotionally Abused:   Marland Kitchen Physically Abused:   . Sexually Abused:      Family History  Problem Relation Age of Onset  . Heart disease Father   . Heart disease Brother   . Breast cancer Neg Hx      Review of Systems: A 12-system review of systems was performed and is negative except as noted in the HPI.  Labs: No results for input(s): CKTOTAL, CKMB, TROPONINI in the last 72 hours. Lab Results  Component Value Date   WBC 7.2 01/21/2020   HGB 15.8 (H) 01/21/2020   HCT 47.1 (H) 01/21/2020   MCV 91.8 01/21/2020   PLT 220 01/21/2020    Recent Labs  Lab 01/21/20 0942  NA 138  K 4.7  CL 100  CO2 28  BUN 26*  CREATININE 0.96  CALCIUM 9.3  GLUCOSE 158*   No results found for: CHOL, HDL, LDLCALC, TRIG No results found for: DDIMER  Radiology/Studies:  DG Chest 2 View  Result  Date: 01/21/2020 CLINICAL DATA:  Syncope. Cardioversion today for atrial fibrillation. EXAM: CHEST - 2 VIEW COMPARISON:  10/13/2017 FINDINGS: The heart size and mediastinal contours are within normal limits. Both lungs are clear. The visualized skeletal structures are unremarkable. IMPRESSION: No active cardiopulmonary disease. Electronically Signed   By: Franchot Gallo M.D.   On: 01/21/2020  10:28    Wt Readings from Last 3 Encounters:  01/21/20 81.6 kg  01/21/20 81.6 kg  01/28/19 100.2 kg    EKG: Sinus rhythm  Physical Exam:  Blood pressure 107/70, pulse 65, temperature 98.4 F (36.9 C), temperature source Oral, resp. rate 16, height 5\' 2"  (1.575 m), weight 81.6 kg, SpO2 97 %. Body mass index is 32.92 kg/m. General: Well developed, well nourished, in no acute distress. Head: Normocephalic, atraumatic, sclera non-icteric, no xanthomas, nares are without discharge.  Neck: Negative for carotid bruits. JVD not elevated. Lungs: Clear bilaterally to auscultation without wheezes, rales, or rhonchi. Breathing is unlabored. Heart: RRR with S1 S2. No murmurs, rubs, or gallops appreciated. Abdomen: Soft, non-tender, non-distended with normoactive bowel sounds. No hepatomegaly. No rebound/guarding. No obvious abdominal masses. Msk:  Strength and tone appear normal for age. Extremities: No clubbing or cyanosis. No edema.  Distal pedal pulses are 2+ and equal bilaterally. Neuro: Alert and oriented X 3. No facial asymmetry. No focal deficit. Moves all extremities spontaneously. Psych:  Responds to questions appropriately with a normal affect.     Assessment and Plan  59 year old female who recently went cardioversion this morning receiving propofol for anesthesia.  She is on flecainide, Cardizem and metoprolol.  She took all the drugs this morning.  She had her flecainide reduced to 100 mg twice daily.  She was taken off of Cardizem and remained on metoprolol tartrate 25 mg once daily.  On going home  she had apparent syncopal episode.  She was told to go back to the emergency room.  Work-up there was unremarkable.  EKG showed sinus rhythm.  She received several 100 cc of IV normal saline.  She has received 1 sublingual Zofran for nausea.  She had no pauses or A. fib.  She was felt ready for discharge.  Okay for discharge with outpatient follow-up with Dr. Nehemiah Massed in 1 week.  Signed, Teodoro Spray MD 01/21/2020, 1:59 PM Pager: [phone]

## 2020-01-21 NOTE — Transfer of Care (Signed)
Immediate Anesthesia Transfer of Care Note  Patient: Barbara Johnston  Procedure(s) Performed: CARDIOVERSION (N/A )  Patient Location: Cath Lab  Anesthesia Type:General  Level of Consciousness: drowsy  Airway & Oxygen Therapy: Patient Spontanous Breathing and Patient connected to nasal cannula oxygen  Post-op Assessment: Report given to RN and Post -op Vital signs reviewed and stable  Post vital signs: Reviewed and stable  Last Vitals:  Vitals Value Taken Time  BP 104/76 01/21/20 0745  Temp    Pulse 60 01/21/20 0745  Resp 20 01/21/20 0745  SpO2 96 % 01/21/20 0745    Last Pain:  Vitals:   01/21/20 0705  TempSrc: Oral  PainSc: 0-No pain         Complications: No complications documented.

## 2020-01-21 NOTE — ED Triage Notes (Signed)
Pt had cardioversion this morning for a-fib and husband reports they left the hospital around 820a and just got home when the pt had a syncopal episode while sitting in a chair, on arrival to the [facility] pt had an episode of vomiting, pt is pale in clammy on arrival , c/o feeling lightheaded, denies any pain at present.

## 2020-01-21 NOTE — ED Provider Notes (Signed)
[facility] Emergency Department Provider Note  ____________________________________________   First MD Initiated Contact with Patient 01/21/20 [phone]     (approximate)  I have reviewed the triage vital signs and the nursing notes.   HISTORY  Chief Complaint Loss of Consciousness   HPI Barbara Johnston is a 59 y.o. female with PMH significant for history of paroxysmal atrial fibrillation hypertension, benign essential hypertension, PVCs, hyperlipidemia, and obesity who underwent elective cardioversion earlier this morning who presents for assessment of a syncopal episode that occurred after she returned home.  Patient states she felt okay until she got home when she felt suddenly lightheaded and per her husband was passed out for least 45 seconds while in recliner.  Patient states she had some nonbloody nonbilious vomiting and nausea but feels her lightheadedness is significantly improved and does not currently feel nauseous.  No prior similar episodes.  Other than being related to surgery this morning no other clear alleviating or aggravating factors.  Patient denies any fevers, chills, cough, chest pain, abdominal pain, diarrhea, dysuria, rash, or recent injuries.  States she contacted her cardiologist before coming to ED [place] where she was coming here.      Past Medical History:  Diagnosis Date  . Atrial fibrillation ([place])   . Gastritis   . Gastrointestinal ulcer   . GERD (gastroesophageal reflux disease)   . Hyperlipidemia   . Hypertension   . Obesity     Patient Active Problem List   Diagnosis Date Noted  . A-fib ([place]) 04/18/2019  . Varicose veins of both lower extremities with inflammation 01/30/2019  . Chronic venous insufficiency 01/30/2019  . Hyperlipidemia 01/30/2019  . Essential hypertension 01/30/2019  . GERD (gastroesophageal reflux disease) 01/30/2019  . Pneumonia 10/13/2017  . Vitamin D deficiency 11/09/2016  . PVC (premature  ventricular contraction) 03/29/2013    Past Surgical History:  Procedure Laterality Date  . ABDOMINAL HYSTERECTOMY    . CARDIAC ELECTROPHYSIOLOGY MAPPING AND ABLATION    . CHOLECYSTECTOMY    . OOPHORECTOMY      Prior to Admission medications   Medication Sig Start Date End Date Taking? Authorizing Provider  acetaminophen (TYLENOL) 500 MG tablet Take 500 mg by mouth every 6 (six) hours as needed for moderate pain or headache.    [provider]  apixaban (ELIQUIS) 5 MG TABS tablet Take 1 tablet (5 mg total) by mouth 2 (two) times daily. 10/17/17   Fritzi Mandes, MD  atorvastatin (LIPITOR) 20 MG tablet Take 20 mg by mouth daily.  01/24/19   [provider]  BLACK COHOSH PO Take 2 tablets by mouth daily. Menosense    [provider]  Calcium Carb-Cholecalciferol (CALCIUM 600 + D PO) Take 1 tablet by mouth daily.    [provider]  flecainide (TAMBOCOR) 100 MG tablet Take 100 mg by mouth 2 (two) times daily. Starting on Friday night, was asked to take 150mg  twice daily by cardiology    [provider]  furosemide (LASIX) 20 MG tablet Take 20 mg by mouth daily.  08/12/15   [provider]  L-Lysine HCl 500 MG TABS Take 500 mg by mouth daily.     [provider]  magnesium oxide (MAG-OX) 400 MG tablet Take 800 mg by mouth daily.     [provider]  metoprolol succinate (TOPROL-XL) 50 MG 24 hr tablet Take 1 tablet (50 mg total) by mouth daily. Take with or immediately following a meal. 01/21/20   Serafina Royals  J, MD  Multiple Vitamin (MULTI-VITAMINS) TABS Take 1 tablet by mouth daily.     [provider]  omeprazole (PRILOSEC) 20 MG capsule Take 20 mg by mouth daily.     [provider]  potassium chloride (K-DUR) 10 MEQ tablet Take 10 mEq by mouth daily.  08/20/15   [provider]  vitamin E 400 UNIT capsule Take 400 Units by mouth daily.     [provider]    Allergies Codeine and  Erythromycin  Family History  Problem Relation Age of Onset  . Heart disease Father   . Heart disease Brother   . Breast cancer Neg Hx     Social History Social History   Tobacco Use  . Smoking status: Never Smoker  . Smokeless tobacco: Never Used  Vaping Use  . Vaping Use: Never used  Substance Use Topics  . Alcohol use: Yes  . Drug use: Never    Review of Systems  Review of Systems  Constitutional: Negative for chills and fever.  HENT: Negative for sore throat.   Eyes: Negative for pain.  Respiratory: Negative for cough and stridor.   Cardiovascular: Negative for chest pain.  Gastrointestinal: Positive for nausea and vomiting.  Skin: Negative for rash.  Neurological: Positive for dizziness and loss of consciousness. Negative for seizures and headaches.  Psychiatric/Behavioral: Negative for suicidal ideas.  All other systems reviewed and are negative.     ____________________________________________   PHYSICAL EXAM:  VITAL SIGNS: ED Triage Vitals  Enc Vitals Group     BP 01/21/20 0937 104/69     Pulse Rate 01/21/20 0937 (!) 54     Resp 01/21/20 0937 16     Temp 01/21/20 0942 98.7 F (37.1 C)     Temp Source 01/21/20 0937 Oral     SpO2 01/21/20 0937 96 %     Weight 01/21/20 0938 180 lb (81.6 kg)     Height 01/21/20 0938 5\' 2"  (1.575 m)     Head Circumference --      Peak Flow --      Pain Score 01/21/20 0937 0     Pain Loc --      Pain Edu? --      Excl. in [place]? --    Vitals:   01/21/20 0942 01/21/20 1104  BP:  105/70  Pulse:  (!) 56  Resp:  12  Temp: 98.7 F (37.1 C)   SpO2:  99%   Physical Exam Vitals and nursing note reviewed.  Constitutional:      General: She is not in acute distress.    Appearance: She is well-developed.  HENT:     Head: Normocephalic and atraumatic.     Right Ear: External ear normal.     Left Ear: External ear normal.     Nose: Nose normal.     Mouth/Throat:     Mouth: Mucous membranes are moist.  Eyes:      Conjunctiva/sclera: Conjunctivae normal.  Cardiovascular:     Rate and Rhythm: Normal rate and regular rhythm.     Heart sounds: No murmur heard.   Pulmonary:     Effort: Pulmonary effort is normal. No respiratory distress.     Breath sounds: Normal breath sounds.  Abdominal:     Palpations: Abdomen is soft.     Tenderness: There is no abdominal tenderness.  Musculoskeletal:     Cervical back: Neck supple.  Skin:    General: Skin is warm and dry.  Neurological:     Mental Status: She is alert.  Psychiatric:        Mood and Affect: Mood normal.      ____________________________________________   LABS (all labs ordered are listed, but only abnormal results are displayed)  Labs Reviewed  BASIC METABOLIC PANEL - Abnormal; Notable for the following components:      Result Value   Glucose, Bld 158 (*)    BUN 26 (*)    All other components within normal limits  CBC - Abnormal; Notable for the following components:   RBC 5.13 (*)    Hemoglobin 15.8 (*)    HCT 47.1 (*)    All other components within normal limits  SARS CORONAVIRUS 2 BY RT PCR (HOSPITAL ORDER, [facility])  MAGNESIUM  TSH  URINALYSIS, COMPLETE (UACMP) WITH MICROSCOPIC  CBG MONITORING, ED  TROPONIN I (HIGH SENSITIVITY)  TROPONIN I (HIGH SENSITIVITY)   ____________________________________________  EKG  Sinus bradycardia with a ventricular rate of 55, left axis deviation, incomplete right bundle branch block, and T wave inversions in the inferior and lateral leads. ____________________________________________  RADIOLOGY   Official radiology report(s): DG Chest 2 View  Result Date: 01/21/2020 CLINICAL DATA:  Syncope. Cardioversion today for atrial fibrillation. EXAM: CHEST - 2 VIEW COMPARISON:  10/13/2017 FINDINGS: The heart size and mediastinal contours are within normal limits. Both lungs are clear. The visualized skeletal structures are unremarkable. IMPRESSION: No active  cardiopulmonary disease. Electronically Signed   By: Franchot Gallo M.D.   On: 01/21/2020 10:28    ____________________________________________   PROCEDURES  Procedure(s) performed (including Critical Care):  .1-3 Lead EKG Interpretation Performed by: Lucrezia Starch, MD Authorized by: Lucrezia Starch, MD     Interpretation: normal     ECG rate assessment: normal     Rhythm: sinus rhythm     Ectopy: none     Conduction: normal       ____________________________________________   INITIAL IMPRESSION / ASSESSMENT AND PLAN / ED COURSE        Overall unclear etiology for patient's syncopal episode arrhythmia versus side effects from sedation earlier this morning versus vasovagal versus other unclear urology.  Low suspicion for ACS given patient denies any chest pain and troponin is nonelevated.  However will plan to obtain a second 2-hour troponin.  Low suspicion for PE as patient is anticoagulated on Eliquis and denies any shortness of breath no signs of respiratory distress on exam.  Chest x-ray is unremarkable for evidence of volume overload or other acute intrathoracic process.  Patient is not anemic has no significant metabolic derangements identified on BMP.  Cardiology service consulted for recommendations.  Patient was seen in emergency room and evaluated by Dr.Fath, Javier Docker, MD. Per Dr. Ubaldo Glassing, he recommends IV fluid hydration and a period of 1 to 2-hour observation and if patient is feeling better she is safe for discharge with plan for outpatient follow-up. On several reassessments patient continued to deny any return of her lightheadedness and given stability of stable vital signs with otherwise reassuring work-up and safe for discharge from cardiology's perspective I do believe patient is safe for discharge with plan for outpatient follow-up. Discharge stable condition. Strict return precautions advised and discussed.  Medications  sodium chloride flush (NS) 0.9 %  injection 3 mL (3 mLs Intravenous Not Given 01/21/20 1013)  0.9 %  sodium chloride infusion ( Intravenous New Bag/Given 01/21/20 1159)  ondansetron (ZOFRAN-ODT) disintegrating tablet 4 mg (4 mg Oral  Given 01/21/20 1050)       ____________________________________________   FINAL CLINICAL IMPRESSION(S) / ED DIAGNOSES  Final diagnoses:  Syncope and collapse     ED Discharge Orders    None       Note:  This document was prepared using Dragon voice recognition software and may include unintentional dictation errors.   Lucrezia Starch, MD 01/21/20 1254

## 2020-01-22 ENCOUNTER — Encounter: Payer: Self-pay | Admitting: Internal Medicine

## 2020-01-22 NOTE — Anesthesia Postprocedure Evaluation (Signed)
Anesthesia Post Note  Patient: Barbara Johnston  Procedure(s) Performed: CARDIOVERSION (N/A )  Patient location during evaluation: Specials Recovery Anesthesia Type: General Level of consciousness: awake and alert Pain management: pain level controlled Vital Signs Assessment: post-procedure vital signs reviewed and stable Respiratory status: spontaneous breathing, nonlabored ventilation, respiratory function stable and patient connected to nasal cannula oxygen Cardiovascular status: blood pressure returned to baseline and stable Postop Assessment: no apparent nausea or vomiting Anesthetic complications: no   No complications documented.   Last Vitals:  Vitals:   01/21/20 0800 01/21/20 0815  BP: 103/73 97/76  Pulse: (!) 58 (!) 56  Resp: 13 15  Temp:    SpO2: 95% 96%    Last Pain:  Vitals:   01/21/20 0815  TempSrc:   PainSc: 0-No pain                 Arita Miss

## 2020-02-19 ENCOUNTER — Other Ambulatory Visit: Payer: Self-pay | Admitting: Internal Medicine

## 2020-02-19 DIAGNOSIS — Z1231 Encounter for screening mammogram for malignant neoplasm of breast: Secondary | ICD-10-CM

## 2020-02-26 HISTORY — PX: CARDIAC ELECTROPHYSIOLOGY MAPPING AND ABLATION: SHX1292

## 2020-04-20 ENCOUNTER — Ambulatory Visit
Admission: RE | Admit: 2020-04-20 | Discharge: 2020-04-20 | Disposition: A | Discharge status: Home / Self Care | Payer: BC Managed Care – PPO | Source: Ambulatory Visit | Attending: Internal Medicine | Admitting: Internal Medicine

## 2020-04-20 ENCOUNTER — Other Ambulatory Visit: Payer: Self-pay

## 2020-04-20 DIAGNOSIS — Z1231 Encounter for screening mammogram for malignant neoplasm of breast: Secondary | ICD-10-CM | POA: Diagnosis present

## 2021-03-01 ENCOUNTER — Ambulatory Visit: Payer: BC Managed Care – PPO | Admitting: Anesthesiology

## 2021-03-01 ENCOUNTER — Ambulatory Visit
Admission: RE | Admit: 2021-03-01 | Discharge: 2021-03-01 | Disposition: A | Discharge status: Home / Self Care | Payer: BC Managed Care – PPO | Attending: Gastroenterology | Admitting: Gastroenterology

## 2021-03-01 ENCOUNTER — Encounter
Admission: RE | Disposition: A | Discharge status: Home / Self Care | Payer: Self-pay | Source: Home / Self Care | Attending: Gastroenterology

## 2021-03-01 ENCOUNTER — Encounter: Payer: Self-pay | Admitting: *Deleted

## 2021-03-01 DIAGNOSIS — K641 Second degree hemorrhoids: Secondary | ICD-10-CM | POA: Diagnosis not present

## 2021-03-01 DIAGNOSIS — D128 Benign neoplasm of rectum: Secondary | ICD-10-CM | POA: Insufficient documentation

## 2021-03-01 DIAGNOSIS — Z1211 Encounter for screening for malignant neoplasm of colon: Secondary | ICD-10-CM | POA: Insufficient documentation

## 2021-03-01 DIAGNOSIS — E785 Hyperlipidemia, unspecified: Secondary | ICD-10-CM | POA: Diagnosis not present

## 2021-03-01 DIAGNOSIS — Z8371 Family history of colonic polyps: Secondary | ICD-10-CM | POA: Insufficient documentation

## 2021-03-01 DIAGNOSIS — K219 Gastro-esophageal reflux disease without esophagitis: Secondary | ICD-10-CM | POA: Insufficient documentation

## 2021-03-01 DIAGNOSIS — Z6833 Body mass index (BMI) 33.0-33.9, adult: Secondary | ICD-10-CM | POA: Diagnosis not present

## 2021-03-01 DIAGNOSIS — I10 Essential (primary) hypertension: Secondary | ICD-10-CM | POA: Insufficient documentation

## 2021-03-01 DIAGNOSIS — Z885 Allergy status to narcotic agent status: Secondary | ICD-10-CM | POA: Insufficient documentation

## 2021-03-01 DIAGNOSIS — Z90721 Acquired absence of ovaries, unilateral: Secondary | ICD-10-CM | POA: Insufficient documentation

## 2021-03-01 DIAGNOSIS — E669 Obesity, unspecified: Secondary | ICD-10-CM | POA: Insufficient documentation

## 2021-03-01 DIAGNOSIS — Z8711 Personal history of peptic ulcer disease: Secondary | ICD-10-CM | POA: Insufficient documentation

## 2021-03-01 DIAGNOSIS — Z79899 Other long term (current) drug therapy: Secondary | ICD-10-CM | POA: Diagnosis not present

## 2021-03-01 DIAGNOSIS — Z9049 Acquired absence of other specified parts of digestive tract: Secondary | ICD-10-CM | POA: Diagnosis not present

## 2021-03-01 DIAGNOSIS — Z7901 Long term (current) use of anticoagulants: Secondary | ICD-10-CM | POA: Diagnosis not present

## 2021-03-01 DIAGNOSIS — D123 Benign neoplasm of transverse colon: Secondary | ICD-10-CM | POA: Insufficient documentation

## 2021-03-01 DIAGNOSIS — K5730 Diverticulosis of large intestine without perforation or abscess without bleeding: Secondary | ICD-10-CM | POA: Insufficient documentation

## 2021-03-01 DIAGNOSIS — K644 Residual hemorrhoidal skin tags: Secondary | ICD-10-CM | POA: Insufficient documentation

## 2021-03-01 DIAGNOSIS — Z881 Allergy status to other antibiotic agents status: Secondary | ICD-10-CM | POA: Diagnosis not present

## 2021-03-01 DIAGNOSIS — I4891 Unspecified atrial fibrillation: Secondary | ICD-10-CM | POA: Insufficient documentation

## 2021-03-01 DIAGNOSIS — Z90710 Acquired absence of both cervix and uterus: Secondary | ICD-10-CM | POA: Insufficient documentation

## 2021-03-01 HISTORY — PX: COLONOSCOPY WITH PROPOFOL: SHX5780

## 2021-03-01 SURGERY — COLONOSCOPY WITH PROPOFOL
Anesthesia: General

## 2021-03-01 MED ORDER — SODIUM CHLORIDE 0.9 % IV SOLN: INTRAVENOUS | Status: DC

## 2021-03-01 MED ORDER — SCOPOLAMINE 1 MG/3DAYS TD PT72
1.0000 | MEDICATED_PATCH | Freq: Once | TRANSDERMAL | Status: DC
  Administered 2021-03-01: 1.5 mg via TRANSDERMAL

## 2021-03-01 MED ORDER — DEXAMETHASONE SODIUM PHOSPHATE 10 MG/ML IJ SOLN
INTRAMUSCULAR | Status: DC | PRN
  Administered 2021-03-01: 10 mg via INTRAVENOUS

## 2021-03-01 MED ORDER — GLYCOPYRROLATE 0.2 MG/ML IJ SOLN
INTRAMUSCULAR | Status: DC | PRN
  Administered 2021-03-01: .2 mg via INTRAVENOUS

## 2021-03-01 MED ORDER — ONDANSETRON HCL 4 MG/2ML IJ SOLN
INTRAMUSCULAR | Status: AC
  Filled 2021-03-01: qty 2

## 2021-03-01 MED ORDER — ONDANSETRON HCL 4 MG/2ML IJ SOLN
INTRAMUSCULAR | Status: DC | PRN
  Administered 2021-03-01: 4 mg via INTRAVENOUS

## 2021-03-01 MED ORDER — PROPOFOL 10 MG/ML IV BOLUS
INTRAVENOUS | Status: DC | PRN
  Administered 2021-03-01: 20 mg via INTRAVENOUS
  Administered 2021-03-01: 50 mg via INTRAVENOUS

## 2021-03-01 MED ORDER — GLYCOPYRROLATE 0.2 MG/ML IJ SOLN
INTRAMUSCULAR | Status: AC
  Filled 2021-03-01: qty 1

## 2021-03-01 MED ORDER — SCOPOLAMINE 1 MG/3DAYS TD PT72
MEDICATED_PATCH | TRANSDERMAL | Status: AC
  Filled 2021-03-01: qty 1

## 2021-03-01 MED ORDER — LIDOCAINE HCL (CARDIAC) PF 100 MG/5ML IV SOSY
PREFILLED_SYRINGE | INTRAVENOUS | Status: DC | PRN
  Administered 2021-03-01: 50 mg via INTRAVENOUS

## 2021-03-01 MED ORDER — LIDOCAINE HCL (PF) 2 % IJ SOLN
INTRAMUSCULAR | Status: AC
  Filled 2021-03-01: qty 5

## 2021-03-01 MED ORDER — PROPOFOL 500 MG/50ML IV EMUL
INTRAVENOUS | Status: AC
  Filled 2021-03-01: qty 50

## 2021-03-01 MED ORDER — PROPOFOL 500 MG/50ML IV EMUL
INTRAVENOUS | Status: DC | PRN
  Administered 2021-03-01: 150 ug/kg/min via INTRAVENOUS

## 2021-03-01 NOTE — Anesthesia Preprocedure Evaluation (Addendum)
Anesthesia Evaluation  Patient identified by MRN, date of birth, ID band Patient awake    Reviewed: Allergy & Precautions, NPO status , Patient's Chart, lab work & pertinent test results  History of Anesthesia Complications (+) PONV and history of anesthetic complications  Airway Mallampati: I  TM Distance: >3 FB Neck ROM: Full    Dental no notable dental hx.    Pulmonary    Pulmonary exam normal        Cardiovascular Exercise Tolerance: Good hypertension, Pt. on medications Normal cardiovascular examI     Neuro/Psych    GI/Hepatic GERD  ,  Endo/Other    Renal/GU      Musculoskeletal   Abdominal Normal abdominal exam  (+)   Peds  Hematology   Anesthesia Other Findings   Reproductive/Obstetrics                            Anesthesia Physical Anesthesia Plan  ASA: 2  Anesthesia Plan: General   Post-op Pain Management:    Induction: Intravenous  PONV Risk Score and Plan:   Airway Management Planned: Natural Airway and Simple Face Mask  Additional Equipment: None  Intra-op Plan:   Post-operative Plan:   Informed Consent: I have reviewed the patients History and Physical, chart, labs and discussed the procedure including the risks, benefits and alternatives for the proposed anesthesia with the patient or authorized representative who has indicated his/her understanding and acceptance.       Plan Discussed with: CRNA  Anesthesia Plan Comments:         Anesthesia Quick Evaluation

## 2021-03-01 NOTE — Anesthesia Postprocedure Evaluation (Signed)
Anesthesia Post Note  Patient: Barbara Johnston  Procedure(s) Performed: COLONOSCOPY WITH PROPOFOL  Patient location during evaluation: PACU Anesthesia Type: General Level of consciousness: awake and alert Pain management: pain level controlled Vital Signs Assessment: post-procedure vital signs reviewed and stable Respiratory status: spontaneous breathing, nonlabored ventilation, respiratory function stable and patient connected to nasal cannula oxygen Cardiovascular status: stable and blood pressure returned to baseline Postop Assessment: no apparent nausea or vomiting Anesthetic complications: no   No notable events documented.   Last Vitals:  Vitals:   03/01/21 0749  BP: 138/76  Pulse: 71  Resp: 20  Temp: (!) 36.3 C  SpO2: 100%    Last Pain:  Vitals:   03/01/21 0749  TempSrc: Temporal  PainSc: 0-No pain                 Culver Feighner Doyne Keel

## 2021-03-01 NOTE — Transfer of Care (Signed)
Immediate Anesthesia Transfer of Care Note  Patient: Barbara Johnston  Procedure(s) Performed: COLONOSCOPY WITH PROPOFOL  Patient Location: PACU  Anesthesia Type:General  Level of Consciousness: awake, alert  and oriented  Airway & Oxygen Therapy: Patient Spontanous Breathing  Post-op Assessment: Report given to RN and Post -op Vital signs reviewed and stable  Post vital signs: Reviewed and stable  Last Vitals:  Vitals Value Taken Time  BP 127/73 03/01/21 0939  Temp    Pulse 74 03/01/21 0940  Resp 21 03/01/21 0940  SpO2 97 % 03/01/21 0940  Vitals shown include unvalidated device data.  Last Pain:  Vitals:   03/01/21 0749  TempSrc: Temporal  PainSc: 0-No pain         Complications: No notable events documented.

## 2021-03-01 NOTE — Anesthesia Postprocedure Evaluation (Signed)
Anesthesia Post Note  Patient: Barbara Johnston  Procedure(s) Performed: COLONOSCOPY WITH PROPOFOL  Anesthesia Type: General Anesthetic complications: no   No notable events documented.   Last Vitals:  Vitals:   03/01/21 0949 03/01/21 0959  BP: 121/83 115/78  Pulse: 74 72  Resp: 13 16  Temp:    SpO2: 100% 100%    Last Pain:  Vitals:   03/01/21 0959  TempSrc:   PainSc: 0-No pain                 Leeona Mccardle Doyne Keel

## 2021-03-01 NOTE — Interval H&P Note (Signed)
History and Physical Interval Note: Preprocedure H&P from 03/01/21  was reviewed and there was no interval change after seeing and examining the patient.  Written consent was obtained from the patient after discussion of risks, benefits, and alternatives. Patient has consented to proceed with Colonoscopy with possible intervention   03/01/2021 8:35 AM  Barbara Johnston  has presented today for surgery, with the diagnosis of Z79.01, Anticoag long term, Colon screening Z12.11.  The various methods of treatment have been discussed with the patient and family. After consideration of risks, benefits and other options for treatment, the patient has consented to  Procedure(s): COLONOSCOPY WITH PROPOFOL (N/A) as a surgical intervention.  The patient's history has been reviewed, patient examined, no change in status, stable for surgery.  I have reviewed the patient's chart and labs.  Questions were answered to the patient's satisfaction.     Annamaria Helling

## 2021-03-01 NOTE — H&P (Signed)
[facility] Pre-Procedure H&P   Patient ID: Barbara Johnston is a 60 y.o. female.  Gastroenterology Provider: Annamaria Helling, DO  Referring Provider: Laurine Blazer, PA  PCP: Rusty Aus, MD  Date: 03/01/2021  HPI Ms. Barbara Johnston is a 60 y.o. female who presents today for Colonoscopy for 10 year screening colonoscopy.  BM have been regular since starting probiotic. No melena hematochezia diarrhea or constipation.  Last dose of eliquis was Wednesday  Sister with h/o benign polyps  No other acute gi complaints.  Past Medical History:  Diagnosis Date   Atrial fibrillation ([place])    Gastritis    Gastrointestinal ulcer    GERD (gastroesophageal reflux disease)    Hyperlipidemia    Hypertension    Obesity     Past Surgical History:  Procedure Laterality Date   ABDOMINAL HYSTERECTOMY     CARDIAC ELECTROPHYSIOLOGY MAPPING AND ABLATION     CARDIOVERSION N/A 01/21/2020   Procedure: CARDIOVERSION;  Surgeon: Corey Skains, MD;  Location: [facility] ORS;  Service: Cardiovascular;  Laterality: N/A;   CHOLECYSTECTOMY     OOPHORECTOMY      Family History Sister with benign polyps No h/o GI disease or malignancy  Review of Systems  Constitutional:  Negative for activity change, appetite change, fatigue and fever.  HENT:  Negative for trouble swallowing and voice change.   Respiratory:  Negative for shortness of breath and wheezing.   Cardiovascular:  Negative for chest pain and palpitations.  Gastrointestinal:  Negative for abdominal distention, abdominal pain, anal bleeding, blood in stool, constipation, diarrhea, nausea, rectal pain and vomiting.  Musculoskeletal:  Negative for arthralgias and myalgias.  Skin:  Negative for color change and pallor.  Neurological:  Negative for dizziness, syncope and weakness.  Psychiatric/Behavioral:  Negative for confusion.   All other systems reviewed and are negative.   Medications No current facility-administered  medications on file prior to encounter.   Current Outpatient Medications on File Prior to Encounter  Medication Sig Dispense Refill   acetaminophen (TYLENOL) 500 MG tablet Take 500 mg by mouth every 6 (six) hours as needed for moderate pain or headache.     apixaban (ELIQUIS) 5 MG TABS tablet Take 1 tablet (5 mg total) by mouth 2 (two) times daily. 60 tablet 1   atorvastatin (LIPITOR) 20 MG tablet Take 20 mg by mouth daily.      BLACK COHOSH PO Take 2 tablets by mouth daily. Menosense     Calcium Carb-Cholecalciferol (CALCIUM 600 + D PO) Take 1 tablet by mouth daily.     furosemide (LASIX) 20 MG tablet Take 20 mg by mouth daily.      L-Lysine HCl 500 MG TABS Take 500 mg by mouth daily.      magnesium oxide (MAG-OX) 400 MG tablet Take 800 mg by mouth daily.      metoprolol succinate (TOPROL-XL) 50 MG 24 hr tablet Take 1 tablet (50 mg total) by mouth daily. Take with or immediately following a meal. 90 tablet 4   Multiple Vitamin (MULTI-VITAMINS) TABS Take 1 tablet by mouth daily.      omeprazole (PRILOSEC) 20 MG capsule Take 20 mg by mouth daily.      ondansetron (ZOFRAN) 4 MG tablet Take 1 tablet (4 mg total) by mouth every 8 (eight) hours as needed for up to 10 doses for nausea or vomiting. 10 tablet 0   vitamin E 400 UNIT capsule Take 400 Units by mouth daily.  flecainide (TAMBOCOR) 100 MG tablet Take 100 mg by mouth 2 (two) times daily. Starting on Friday night, was asked to take '150mg'$  twice daily by cardiology     potassium chloride (K-DUR) 10 MEQ tablet Take 10 mEq by mouth daily.       Pertinent medications related to GI and procedure were reviewed by me with the patient prior to the procedure   Current Facility-Administered Medications:    0.9 %  sodium chloride infusion, , Intravenous, Continuous, Annamaria Helling, DO   scopolamine (TRANSDERM-SCOP) 1 MG/3DAYS 1.5 mg, 1 patch, Transdermal, Once, Lennox Pippins, MD  sodium chloride         Allergies  Allergen  Reactions   Codeine Other (See Comments)    Hallucinations    Erythromycin     initeracts with home med   Allergies were reviewed by me prior to the procedure  Objective    Vitals:   03/01/21 0749  BP: 138/76  Pulse: 71  Resp: 20  Temp: (!) 97.3 F (36.3 C)  TempSrc: Temporal  SpO2: 100%  Weight: 79.4 kg  Height: '5\' 1"'$  (1.549 m)     Physical Exam Vitals reviewed.  Constitutional:      General: She is not in acute distress.    Appearance: Normal appearance. She is not ill-appearing, toxic-appearing or diaphoretic.  HENT:     Head: Normocephalic and atraumatic.     Nose: Nose normal.     Mouth/Throat:     Mouth: Mucous membranes are moist.     Pharynx: Oropharynx is clear.  Eyes:     General: No scleral icterus.    Extraocular Movements: Extraocular movements intact.  Cardiovascular:     Rate and Rhythm: Normal rate and regular rhythm.     Heart sounds: Normal heart sounds. No murmur heard.   No friction rub. No gallop.  Pulmonary:     Effort: Pulmonary effort is normal. No respiratory distress.     Breath sounds: Normal breath sounds. No wheezing, rhonchi or rales.  Abdominal:     General: Bowel sounds are normal. There is no distension.     Palpations: Abdomen is soft.     Tenderness: There is no abdominal tenderness. There is no guarding or rebound.  Musculoskeletal:     Cervical back: Neck supple.     Right lower leg: No edema.     Left lower leg: No edema.  Skin:    General: Skin is warm and dry.     Coloration: Skin is not jaundiced or pale.  Neurological:     General: No focal deficit present.     Mental Status: She is alert and oriented to person, place, and time. Mental status is at baseline.  Psychiatric:        Mood and Affect: Mood normal.        Behavior: Behavior normal.        Thought Content: Thought content normal.        Judgment: Judgment normal.     Assessment:  Ms. Barbara Johnston is a 60 y.o. female  who presents today for  Colonoscopy for 10 year screening colonoscopy.  Plan:  Colonoscopy with possible intervention today  Last dose of eliquis was Wednesday  Colonoscopy with possible biopsy, control of bleeding, polypectomy, and interventions as necessary has been discussed with the patient/patient representative. Informed consent was obtained from the patient/patient representative after explaining the indication, nature, and risks of the procedure including but not limited to death,  bleeding, perforation, missed neoplasm/lesions, cardiorespiratory compromise, and reaction to medications. Opportunity for questions was given and appropriate answers were provided. Patient/patient representative has verbalized understanding is amenable to undergoing the procedure.   Annamaria Helling, DO  [facility]  Portions of the record may have been created with voice recognition software. Occasional wrong-word or 'sound-a-like' substitutions may have occurred due to the inherent limitations of voice recognition software.  Read the chart carefully and recognize, using context, where substitutions may have occurred.

## 2021-03-01 NOTE — Anesthesia Postprocedure Evaluation (Signed)
Anesthesia Post Note  Patient: Barbara Johnston  Procedure(s) Performed: COLONOSCOPY WITH PROPOFOL  Patient location during evaluation: PACU Anesthesia Type: General Level of consciousness: awake and alert Pain management: pain level controlled Vital Signs Assessment: post-procedure vital signs reviewed and stable Respiratory status: spontaneous breathing, nonlabored ventilation, respiratory function stable and patient connected to nasal cannula oxygen Cardiovascular status: stable and blood pressure returned to baseline Postop Assessment: no apparent nausea or vomiting Anesthetic complications: no   No notable events documented.   Last Vitals:  Vitals:   03/01/21 0949 03/01/21 0959  BP: 121/83 115/78  Pulse: 74 72  Resp: 13 16  Temp:    SpO2: 100% 100%    Last Pain:  Vitals:   03/01/21 0959  TempSrc:   PainSc: 0-No pain                 Keondra Haydu Doyne Keel

## 2021-03-01 NOTE — Op Note (Signed)
[facility] Patient Name: Barbara Johnston Procedure Date: 03/01/2021 8:21 AM MRN: [id number] Account #: [id number] Date of Birth: [birth year] Admit Type: Outpatient Age: 60 Room: [facility] ENDO ROOM 2 Gender: Female Note Status: Finalized Instrument Name: Colonoscope [id number] Procedure:             Colonoscopy Indications:           Screening for colorectal malignant neoplasm Providers:             Annamaria Helling DO, DO Referring MD:          Rusty Aus, MD (Referring MD) Medicines:             Monitored Anesthesia Care Complications:         No immediate complications. Estimated blood loss:                         Minimal. Procedure:             Pre-Anesthesia Assessment:                        - Prior to the procedure, a History and Physical was                         performed, and patient medications and allergies were                         reviewed. The patient is competent. The risks and                         benefits of the procedure and the sedation options and                         risks were discussed with the patient. All questions                         were answered and informed consent was obtained.                         Patient identification and proposed procedure were                         verified by the physician, the nurse, the anesthetist                         and the technician in the endoscopy suite. Mental                         Status Examination: alert and oriented. Airway                         Examination: normal oropharyngeal airway and neck                         mobility. Respiratory Examination: clear to                         auscultation. CV Examination: RRR, no murmurs, no S3  or S4. Prophylactic Antibiotics: The patient does not                         require prophylactic antibiotics. Prior                         Anticoagulants: The patient has taken Eliquis                          (apixaban), last dose was 5 days prior to procedure.                         ASA Grade Assessment: II - A patient with mild                         systemic disease. After reviewing the risks and                         benefits, the patient was deemed in satisfactory                         condition to undergo the procedure. The anesthesia                         plan was to use monitored anesthesia care (MAC).                         Immediately prior to administration of medications,                         the patient was re-assessed for adequacy to receive                         sedatives. The heart rate, respiratory rate, oxygen                         saturations, blood pressure, adequacy of pulmonary                         ventilation, and response to care were monitored                         throughout the procedure. The physical status of the                         patient was re-assessed after the procedure.                        After obtaining informed consent, the colonoscope was                         passed under direct vision. Throughout the procedure,                         the patient's blood pressure, pulse, and oxygen                         saturations were monitored continuously. The  Colonoscope was introduced through the anus and                         advanced to the the cecum, identified by appendiceal                         orifice and ileocecal valve. The colonoscopy was                         somewhat difficult due to a redundant colon,                         significant looping and a tortuous colon. Successful                         completion of the procedure was aided by using manual                         pressure, straightening and shortening the scope to                         obtain bowel loop reduction, using scope torsion,                         applying abdominal pressure and lavage. The patient                          tolerated the procedure well. The quality of the bowel                         preparation was evaluated using the BBPS [facility] Bowel                         Preparation Scale) with scores of: Right Colon = 3,                         Transverse Colon = 3 and Left Colon = 3 (entire mucosa                         seen well with no residual staining, small fragments                         of stool or opaque liquid). The total BBPS score                         equals 9. The ileocecal valve, appendiceal orifice,                         and rectum were photographed. Findings:      Skin tags were found on perianal exam.      The digital rectal exam was normal. Pertinent negatives include normal       sphincter tone.      Two sessile polyps were found in the rectum and transverse colon. The       polyps were 2 to 3 mm in size. These polyps were removed with a cold       biopsy forceps. Resection and  retrieval were complete. Estimated blood       loss was minimal.      Non-bleeding internal hemorrhoids were found during retroflexion. The       hemorrhoids were Grade II (internal hemorrhoids that prolapse but reduce       spontaneously). Estimated blood loss: none.      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      The exam was otherwise without abnormality on direct and retroflexion       views.      Multiple small-mouthed diverticula were found in the left colon.       Estimated blood loss: none. Impression:            - Perianal skin tags found on perianal exam.                        - Two 2 to 3 mm polyps in the rectum and in the                         transverse colon, removed with a cold biopsy forceps.                         Resected and retrieved.                        - Non-bleeding internal hemorrhoids.                        - The examination was otherwise normal on direct and                         retroflexion  views.                        - Moderate diverticulosis in the left colon. There was                         no evidence of diverticular bleeding. Recommendation:        - Discharge patient to home.                        - Resume previous diet.                        - Resume Eliquis (apixaban) at prior dose tomorrow.                         Refer to referring physician for further adjustment of                         therapy.                        - Continue present medications.                        - Await pathology results.                        - Repeat colonoscopy for surveillance based on  pathology results.                        - Return to referring physician as previously                         scheduled.                        - Will order CT of abdomen pelvis to evaluate                         suspected appendix mucocele Procedure Code(s):     --- Professional ---                        [phone], Colonoscopy, flexible; with biopsy, single or                         multiple Diagnosis Code(s):     --- Professional ---                        Z12.11, Encounter for screening for malignant neoplasm                         of colon                        K62.1, Rectal polyp                        K63.5, Polyp of colon                        K64.1, Second degree hemorrhoids                        K64.4, Residual hemorrhoidal skin tags                        K57.30, Diverticulosis of large intestine without                         perforation or abscess without bleeding CPT copyright 2019 [employer]. All rights reserved. The codes documented in this report are preliminary and upon coder review may  be revised to meet current compliance requirements. Attending Participation:      I personally performed the entire procedure. Volney American, DO Annamaria Helling DO, DO 03/01/2021 9:44:26 AM This report has been signed  electronically. Number of Addenda: 0 Note Initiated On: 03/01/2021 8:21 AM Scope Withdrawal Time: 0 hours 17 minutes 8 seconds  Total Procedure Duration: 0 hours 51 minutes 10 seconds  Estimated Blood Loss:  Estimated blood loss was minimal.      [facility]

## 2021-03-02 ENCOUNTER — Other Ambulatory Visit: Payer: Self-pay | Admitting: Gastroenterology

## 2021-03-02 ENCOUNTER — Encounter: Payer: Self-pay | Admitting: Gastroenterology

## 2021-03-02 DIAGNOSIS — R933 Abnormal findings on diagnostic imaging of other parts of digestive tract: Secondary | ICD-10-CM

## 2021-03-02 DIAGNOSIS — K388 Other specified diseases of appendix: Secondary | ICD-10-CM

## 2021-03-02 LAB — SURGICAL PATHOLOGY

## 2021-03-15 ENCOUNTER — Other Ambulatory Visit: Payer: Self-pay | Admitting: Internal Medicine

## 2021-03-15 DIAGNOSIS — Z1231 Encounter for screening mammogram for malignant neoplasm of breast: Secondary | ICD-10-CM

## 2021-03-26 ENCOUNTER — Ambulatory Visit: Admission: RE | Admit: 2021-03-26 | Payer: BC Managed Care – PPO | Source: Ambulatory Visit

## 2021-04-02 ENCOUNTER — Ambulatory Visit
Admission: RE | Admit: 2021-04-02 | Discharge: 2021-04-02 | Disposition: A | Discharge status: Home / Self Care | Payer: BC Managed Care – PPO | Source: Ambulatory Visit | Attending: Gastroenterology | Admitting: Gastroenterology

## 2021-04-02 ENCOUNTER — Other Ambulatory Visit: Payer: Self-pay

## 2021-04-02 DIAGNOSIS — R933 Abnormal findings on diagnostic imaging of other parts of digestive tract: Secondary | ICD-10-CM | POA: Insufficient documentation

## 2021-04-02 DIAGNOSIS — K388 Other specified diseases of appendix: Secondary | ICD-10-CM

## 2021-04-02 LAB — POCT I-STAT CREATININE: Creatinine, Ser: 0.6 mg/dL (ref 0.44–1.00)

## 2021-04-02 MED ORDER — IOHEXOL 350 MG/ML SOLN
100.0000 mL | Freq: Once | INTRAVENOUS | Status: AC | PRN
  Administered 2021-04-02: 100 mL via INTRAVENOUS

## 2021-04-10 ENCOUNTER — Ambulatory Visit: Payer: Self-pay | Admitting: General Surgery

## 2021-04-15 ENCOUNTER — Other Ambulatory Visit: Payer: Self-pay

## 2021-04-15 ENCOUNTER — Other Ambulatory Visit
Admission: RE | Admit: 2021-04-15 | Discharge: 2021-04-15 | Disposition: A | Discharge status: Home / Self Care | Payer: BC Managed Care – PPO | Source: Ambulatory Visit | Attending: General Surgery | Admitting: General Surgery

## 2021-04-15 HISTORY — DX: Other specified postprocedural states: Z98.890

## 2021-04-15 HISTORY — DX: Other specified postprocedural states: R11.2

## 2021-04-15 HISTORY — DX: Cardiac arrhythmia, unspecified: I49.9

## 2021-04-15 NOTE — Patient Instructions (Addendum)
Your procedure is scheduled on: Wednesday April 21, 2021. Report to Day Surgery inside [facility] 2nd floor  To find out your arrival time please call [phone] between 1PM - 3PM on Tuesday April 20, 2021.  Remember: Instructions that are not followed completely may result in serious medical risk,  up to and including death, or upon the discretion of your surgeon and anesthesiologist your  surgery may need to be rescheduled.     _X__ 1. Do not eat food or drink any fluids after midnight the night before your procedure.                 No chewing gum or hard candies.   __X__2.  On the morning of surgery brush your teeth with toothpaste and water, you                may rinse your mouth with mouthwash if you wish.  Do not swallow any toothpaste of mouthwash.     _X__ 3.  No Alcohol for 24 hours before or after surgery.   _X__ 4.  Do Not Smoke or use e-cigarettes For 24 Hours Prior to Your Surgery.                 Do not use any chewable tobacco products for at least 6 hours prior to                 Surgery.  _X__  5.  Do not use any recreational drugs (marijuana, cocaine, heroin, ecstasy, MDMA or other)                For at least one week prior to your surgery.  Combination of these drugs with anesthesia                May have life threatening results.  __X__6.  Notify your doctor if there is any change in your medical condition      (cold, fever, infections).     Do not wear jewelry, make-up, hairpins, clips or nail polish. Do not wear lotions, powders, or perfumes. You may wear deodorant. Do not shave 48 hours prior to surgery.  Do not bring valuables to the hospital.    [facility] is not responsible for any belongings or valuables.  Contacts, dentures or bridgework may not be worn into surgery. Leave your suitcase in the car. After surgery it may be brought to your room. For patients admitted to the hospital, discharge time is determined by  your treatment team.   Patients discharged the day of surgery will not be allowed to drive home.   Make arrangements for someone to be with you for the first 24 hours of your Same Day Discharge.   __X__ Take these medicines the morning of surgery with A SIP OF WATER:     1. omeprazole (PRILOSEC) 20 MG take one dose the night before and the morning of surgery.    ____ Fleet Enema (as directed)   __X__ Use CHG Soap (or wipes) as directed  __X__ Follow doctors instructions on Bowel Prep the day before surgery.  ____ Use Benzoyl Peroxide Gel as instructed  ____ Use inhalers on the day of surgery  ____ Stop metformin 2 days prior to surgery    ____ Take 1/2 of usual insulin dose the night before surgery. No insulin the morning          of surgery.   __X __ Stop apixaban (ELIQUIS) 2.5 MG 2  days before your surgery as instructed by your doctor.   __X__ One Week prior to surgery- Stop Anti-inflammatories such as Ibuprofen, Aleve, Advil, Motrin, meloxicam (MOBIC), diclofenac, etodolac, ketorolac, Toradol, Daypro, piroxicam, Goody's or BC powders. OK TO USE TYLENOL IF NEEDED   __X__ Stop ALL supplements until after surgery.    ____ Bring C-Pap to the hospital.    If you have any questions regarding your pre-procedure instructions,  Please call Pre-admit Testing at [phone].

## 2021-04-16 ENCOUNTER — Other Ambulatory Visit
Admission: RE | Admit: 2021-04-16 | Discharge: 2021-04-16 | Disposition: A | Discharge status: Home / Self Care | Payer: BC Managed Care – PPO | Source: Ambulatory Visit | Attending: General Surgery | Admitting: General Surgery

## 2021-04-16 ENCOUNTER — Encounter: Payer: Self-pay | Admitting: General Surgery

## 2021-04-16 DIAGNOSIS — Z01810 Encounter for preprocedural cardiovascular examination: Secondary | ICD-10-CM | POA: Diagnosis present

## 2021-04-16 HISTORY — DX: Atherosclerosis of aorta: I70.0

## 2021-04-16 HISTORY — DX: Motion sickness, initial encounter: T75.3XXA

## 2021-04-16 HISTORY — DX: Long term (current) use of anticoagulants: Z79.01

## 2021-04-16 NOTE — Progress Notes (Signed)
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 Clinical nursing staff has updated patient's PMH/PSHx, current medication list, and drug allergies/intolerances to ensure comprehensive history available to assist in medical decision making as it pertains to the aforementioned surgical procedure and anticipated anesthetic course. Extensive review of available clinical information performed. Barbara Johnston PMH and PSHx updated with any diagnoses/procedures that  may have been inadvertently omitted during her intake with the pre-admission testing department's nursing staff.  Clinical Discussion:  Barbara Johnston is a 60 y.o. female who is submitted for pre-surgical anesthesia review and clearance prior to her undergoing the above procedure. Patient has never been a smoker. Pertinent PMH includes: atrial fibrillation, aortic atherosclerosis, aortic dilatation, HTN, HLD GERD (on daily PPI) gastritis, gastrointestinal ulcer, motion sickness.  Patient is followed by cardiology Nehemiah Massed, MD). She was last seen in the cardiology clinic on 11/30/2020; notes reviewed.  At the time of her clinic visit, patient doing well overall from a cardiovascular perspective.  She denied any episodes of chest pain, shortness breath, PND, orthopnea, significant peripheral edema, vertiginous symptoms, or presyncope/syncope.  Patient with intermittent palpitations.  PMH significant for cardiovascular  diagnoses, which include atrial fibrillation; CHA2DS2-VASc Score = 3 (sex, HTN, aortic plaque). Patient is chronically anticoagulated using dose reduced apixaban; compliant with therapy with no evidence or reports of GI bleeding.    Patient underwent cardiac ablation on 05/08/2008, which restored NSR.  Patient was maintained on oral dofetilide, however this was ultimately discontinued due to stability.   Cardiac CT performed on 08/04/2008 revealing normal pulmonary vein configuration with very mild post ablation narrowing at the origins.  The ascending aorta above the sinotubular junction mildly enlarged at 3.7 cm; ascending aorta 2.0 cm.  There was normal left ventricular systolic function with an EF of 61%.  TTE performed on 12/31/2018 revealed a normal left ventricular systolic function with an EF of 50-55%. There is no evidence of significant valvular regurgitation or transvalvular gradient suggestive of stenosis.     Patient with recurrent atrial fibrillation requiring DCCV procedure on 01/21/2020. Currently, rate and rhythm maintained on beta-blocker monotherapy.    Cardiac MRI/MRA performed on 02/21/2020 revealing a globally normal systolic function with an LVEF of 50-55%.  Atria were normal in size.  There was no evidence of significant valvular disease, myocardial infarction, scar, infiltrative disease, or intracardiac thrombus visualized.  Repeat cardiac ablation procedure performed on 02/26/2020 restoring NSR.  Blood pressure reasonably controlled at 130/80 on currently prescribed diuretic and beta-blocker therapies.  Patient is on a statin for HLD.  She is not diabetic. Functional capacity, as defined by DASI, is documented as being >/= 4 METS.  No changes were made to her medication regimen.  Patient to follow-up with outpatient cardiology in 9 months or sooner if needed.  Barbara Johnston underwent CT imaging of her abdomen pelvis on 04/02/2021 revealing a significantly dilated appendix  measuring 22 mm in diameter.  The appendix was predominantly hypodense with possible peripheral calcifications.  Finding concerning  for mucocele versus possible mucoepidermoid carcinoma.  For this reason, patient is scheduled for an elective ROBOT ASSISTED LAPAROSCOPIC PARTIAL RIGHT COLECTOMY on 04/21/2021 with Dr. Herbert Pun, MD.  Given patient's past medical history significant for cardiovascular diagnoses, presurgical cardiac clearance was sought by the PAT team. Per cardiology, "this patient is optimized for surgery and may proceed with the planned procedural course with a LOW risk of significant perioperative cardiovascular complications". Again, this patient is on daily anticoagulation therapy. She has been instructed on recommendations for holding her apixaban for 2 days prior to her procedure with plans to restart as soon as postoperative bleeding risk felt to be minimized by her attending surgeon. The patient has been instructed that her last dose of her anticoagulant will be on 04/18/2021.  Patient reports previous perioperative complications with anesthesia in the past. Patient has a PMH (+) for PONV.  Order placed by PAT for patient to receive a prophylactic preoperative dose of aprepitant. In review of the available records, it is noted that patient underwent a general anesthetic course here (ASA II) in 02/2021 without documented complications.   Vitals with BMI 04/15/2021 03/01/2021 03/01/2021  Height _0  - -  Weight 179 lbs - -  BMI 39.53 - -  Systolic - 202 334  Diastolic - 78 83  Pulse - 72 74    Providers/Specialists:   NOTE: Primary physician provider listed below. Patient may have been seen by APP or partner within same practice.   PROVIDER ROLE / SPECIALTY LAST Tanna Savoy, MD GENERAL SURGERY 04/09/2021  Rusty Aus, MD PRIMARY CARE PROVIDER 04/09/2021  Serafina Royals, MD CARDIOLOGY 11/30/2020  Mylinda Latina, MD ELECTROPHYSIOLOGY 04/24/2020    Allergies:  Codeine  Current Home Medications:   No current facility-administered medications for this encounter.    acetaminophen (TYLENOL) 500 MG tablet   apixaban (ELIQUIS) 2.5 MG TABS tablet   atorvastatin (LIPITOR) 20 MG tablet   BLACK COHOSH PO   Calcium Carb-Cholecalciferol (CALCIUM 600 + D PO)   docusate sodium (COLACE) 100 MG capsule   furosemide (LASIX) 20 MG tablet   L-Lysine HCl 500 MG TABS   magnesium oxide (MAG-OX) 400 MG tablet   metoprolol succinate (TOPROL-XL) 50 MG 24 hr tablet   Multiple Vitamin (MULTI-VITAMINS) TABS   omeprazole (PRILOSEC) 20 MG capsule   potassium chloride (K-DUR) 10 MEQ tablet   Probiotic Product (PROBIOTIC DAILY PO)   vitamin E 400 UNIT capsule   History:   Past Medical History:  Diagnosis Date   Aortic atherosclerosis (HCC)    Ascending aorta dilation ([place]) 08/04/2008   a.) Cardiac CT 08/04/2008: ascending aorta above the sinotubular junction measured 3.7 cm; descending aorta measured 2.0 cm.   Atrial fibrillation ([place])    a.) CHA2DS2-VASc = 3 (sex, HTN, aortic plaque). b.) rate/ryhthm maintained on metoprolol; chronically anticoagulated with reduced dose apixaban. c.) Ablation 05/08/2008. d.) DCCV 01/21/2020. e.) Repeat cardiac ablation 02/26/2020.   Current use of long term anticoagulation    a.) Apixaban   Gastritis    Gastrointestinal ulcer    GERD (gastroesophageal reflux disease)    Hyperlipidemia    Hypertension    Motion sickness    Obesity    PONV (postoperative nausea and vomiting)    Past Surgical History:  Procedure Laterality Date   ABDOMINAL HYSTERECTOMY     CARDIAC ELECTROPHYSIOLOGY MAPPING AND ABLATION N/A 05/08/2008   Procedure: CARDIAC ABLATION WITH PVI x 4; Location: [facility] N/A 02/26/2020  Procedure: CARDIAC ABLATION; Location: [facility]   CARDIOVERSION N/A 01/21/2020   Procedure: CARDIOVERSION;  Surgeon: Corey Skains, MD;  Location: [facility] ORS;  Service:  Cardiovascular;  Laterality: N/A;   CHOLECYSTECTOMY     COLONOSCOPY WITH PROPOFOL N/A 03/01/2021   Procedure: COLONOSCOPY WITH PROPOFOL;  Surgeon: Annamaria Helling, DO;  Location: [facility] ENDOSCOPY;  Service: Gastroenterology;  Laterality: N/A;   OOPHORECTOMY     Family History  Problem Relation Age of Onset   Heart disease Father    Heart disease Brother    Breast cancer Neg Hx    Social History   Tobacco Use   Smoking status: Never   Smokeless tobacco: Never  Vaping Use   Vaping Use: Never used  Substance Use Topics   Alcohol use: Not Currently   Drug use: Never    Pertinent Clinical Results:  LABS: Labs reviewed: Acceptable for surgery.   Ref Range & Units 02/08/2021  WBC (White Blood Cell Count) 4.1 - 10.2 10^3/uL 5.7   RBC (Red Blood Cell Count) 4.04 - 5.48 10^6/uL 4.92   Hemoglobin 12.0 - 15.0 gm/dL 14.8   Hematocrit 35.0 - 47.0 % 44.7   MCV (Mean Corpuscular Volume) 80.0 - 100.0 fl 90.9   MCH (Mean Corpuscular Hemoglobin) 27.0 - 31.2 pg 30.1   MCHC (Mean Corpuscular Hemoglobin Concentration) 32.0 - 36.0 gm/dL 33.1   Platelet Count 150 - 450 10^3/uL 190   RDW-CV (Red Cell Distribution Width) 11.6 - 14.8 % 12.1   MPV (Mean Platelet Volume) 9.4 - 12.4 fl 10.6   Neutrophils 1.50 - 7.80 10^3/uL 3.15   Lymphocytes 1.00 - 3.60 10^3/uL 1.83   Monocytes 0.00 - 1.50 10^3/uL 0.42   Eosinophils 0.00 - 0.55 10^3/uL 0.22   Basophils 0.00 - 0.09 10^3/uL 0.03   Neutrophil % 32.0 - 70.0 % 55.7   Lymphocyte % 10.0 - 50.0 % 32.3   Monocyte % 4.0 - 13.0 % 7.4   Eosinophil % 1.0 - 5.0 % 3.9   Basophil% 0.0 - 2.0 % 0.5   Immature Granulocyte % <=0.7 % 0.2   Immature Granulocyte Count <=0.06 10^3/L 0.01   Resulting Agency  [facility] - LAB  Specimen Collected: 02/08/21 07:20 Last Resulted: 02/08/21 08:05  Received From: [facility]  Result Received: 02/26/21 12:51    Ref Range & Units 02/08/2021  Glucose 70 - 110 mg/dL 105   Sodium 136 - 145  mmol/L 140   Potassium 3.6 - 5.1 mmol/L 4.2   Chloride 97 - 109 mmol/L 104   Carbon Dioxide (CO2) 22.0 - 32.0 mmol/L 30.8   Urea Nitrogen (BUN) 7 - 25 mg/dL 21   Creatinine 0.6 - 1.1 mg/dL 0.7   Glomerular Filtration Rate (eGFR), MDRD Estimate >60 mL/min/1.73sq m 85   Calcium 8.7 - 10.3 mg/dL 9.2   AST  8 - 39 U/L 19   ALT  5 - 38 U/L 15   Alk Phos (alkaline Phosphatase) 34 - 104 U/L 54   Albumin 3.5 - 4.8 g/dL 4.4   Bilirubin, Total 0.3 - 1.2 mg/dL 1.1   Protein, Total 6.1 - 7.9 g/dL 6.6   A/G Ratio 1.0 - 5.0 gm/dL 2.0   Resulting Agency  [facility] - LAB  Specimen Collected: 02/08/21 07:20 Last Resulted: 02/08/21 11:47  Received From: [facility]  Result Received: 02/26/21 12:51    ECG: Date: 04/16/2021 Time ECG obtained: 1047 AM Rate: 67 bpm Rhythm: normal sinus Axis (leads I  and aVF): Normal Intervals: PR 134 ms. QRS 84 ms. QTc 431 ms. ST segment and T wave changes: Nonspecific ST abnormality  Comparison: Similar to previous tracing obtained on 01/21/2020   IMAGING / PROCEDURES: CT ABDOMEN PELVIS WITH CONTRAST performed on 04/02/2021 Severely enlarged appendix is noted without surrounding inflammation. It is predominantly hypodense with possible peripheral calcifications, and is highly concerning for mucocele and possible mucoepidermoid carcinoma.   Mild to moderate right hydronephrosis is noted without obstructing calculus or ureteral dilatation; this is concerning for ureteropelvic junction stenosis or obstruction. 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  Presenting rhythm mitral annular flutter  Redo electrical isolation of the pulmonary veins.  Posterior wall isolation with LA floor and roof lines. Mitral isthmus ablation resulting in termination of mitral annular flutter requiring epicardial ablation within the coronary sinus to achieve isthmus block.   CARDIAC  MRA/MRI performed on 02/21/2020 The left ventricle is normal in cavity size and wall thickness. Global systolic function is normal. The LV ejection fraction is low-normal with a visually estimated LVEF 50-55%. Quantitative analysis was not performed due to the patient's arrhythmia. There are no regional wall motion abnormalities. The right ventricle is normal in cavity size and wall thickness. There is qualitatively low-normal systolic function.  Both atria are normal in size. The aortic valve is trileaflet in morphology. There is no significant valvular disease.  Delayed enhancement imaging demonstrates no evidence of myocardial infarction, scar or infiltrative disease.  No intracardiac thrombus visualized.  There is a trace to small pericardial effusion .  There are likely three pulmonary veins entering the left atrium. A small caliber vein draining the superior segment of the right lower lobe may be a separate vein versus an immediate branch from the right lower pulmonary vein. There is a let sided common trunk draining the left upper and lower pulmonary veins. All veins are patent without significant stenosis proximally. The bi-orthogonal luminal dimensions are  listed below:  RUPV: 2.7 x 1.7 cm  RMPV (see above description): 0.8 x 0.6 cm  RLPV: 2.3 x 1.9 cm  Left common trunk: 2.6 x 1.6 cm  LA measurements:  Transverse (right-left): 5.9 cm  Anterior-posterior: 3.4 cm  Craniocaudal (head-foot): 5.0 cm  The thoracic aorta is nondilated.  The main and proximal branch pulmonary arteries are normal in size  The esophagus courses just the left of midline in proximity to the ostia of the left-sided pulmonary veins.   TRANSTHORACIC ECHOCARDIOGRAM performed on 12/31/2018 LVEF 50-55% Normal left ventricular systolic function Normal right ventricular systolic function Mild mitral, tricuspid, and pulmonary valve regurgitation No evidence of valvular stenosis No pericardial effusion  CARDIAC  ABLATION performed on 05/08/2008 Persistent atrial fibrillation. Pulmonary vein isolation x 4 with linear ablation along the coronary sinus, mitral annulus, linear ablation along the roof line and across the anterior septum.   There were normal conduction intervals post ablation.   There was ablation performed along the right atrium from the inferior vena cava to the tricuspid valve after it was established that the patient had isthmus conduction.   Bidirectional block was confirmed following this ablation line.   At the conclusion of the procedure, right-side premature atrial contractions were noted, although the patient remained in sinus rhythm.   Impression and Plan:  Barbara Johnston has been referred for pre-anesthesia review and clearance prior to her undergoing the planned anesthetic and procedural courses. Available labs, pertinent testing, and imaging results were personally  reviewed by me. This patient has been appropriately cleared by cardiology with an overall LOW risk of significant perioperative cardiovascular complications.  Based on clinical review performed today (04/20/21), barring any significant acute changes in the patient's overall condition, it is anticipated that she will be able to proceed with the planned surgical intervention. Any acute changes in clinical condition may necessitate her procedure being postponed and/or cancelled. Patient will meet with anesthesia team (MD and/or CRNA) on the day of her procedure for preoperative evaluation/assessment. Questions regarding anesthetic course will be fielded at that time.   Pre-surgical instructions were reviewed with the patient during her PAT appointment and questions were fielded by PAT clinical staff. Patient was advised that if any questions or concerns arise prior to her procedure then she should return a call to PAT and/or her surgeon's office to discuss.  Barbara Loh, MSN, APRN, FNP-C, [facility]   Peri-operative Services Nurse Practitioner Phone: [phone] Fax: [phone] 04/20/21 10:29 AM  NOTE: This note has been prepared using Dragon dictation software. Despite my best ability to proofread, there is always the potential that unintentional transcriptional errors may still occur from this process.

## 2021-04-19 ENCOUNTER — Other Ambulatory Visit: Admission: RE | Admit: 2021-04-19 | Payer: BC Managed Care – PPO | Source: Ambulatory Visit

## 2021-04-20 ENCOUNTER — Other Ambulatory Visit
Admission: RE | Admit: 2021-04-20 | Discharge: 2021-04-20 | Disposition: A | Discharge status: Home / Self Care | Payer: BC Managed Care – PPO | Source: Ambulatory Visit | Attending: General Surgery | Admitting: General Surgery

## 2021-04-20 DIAGNOSIS — I700 Atherosclerosis of aorta: Secondary | ICD-10-CM | POA: Diagnosis not present

## 2021-04-20 DIAGNOSIS — Z20822 Contact with and (suspected) exposure to covid-19: Secondary | ICD-10-CM | POA: Insufficient documentation

## 2021-04-20 DIAGNOSIS — K388 Other specified diseases of appendix: Secondary | ICD-10-CM | POA: Diagnosis present

## 2021-04-20 DIAGNOSIS — Z01812 Encounter for preprocedural laboratory examination: Secondary | ICD-10-CM | POA: Insufficient documentation

## 2021-04-20 DIAGNOSIS — I10 Essential (primary) hypertension: Secondary | ICD-10-CM | POA: Diagnosis not present

## 2021-04-20 DIAGNOSIS — C181 Malignant neoplasm of appendix: Secondary | ICD-10-CM | POA: Diagnosis not present

## 2021-04-20 DIAGNOSIS — K219 Gastro-esophageal reflux disease without esophagitis: Secondary | ICD-10-CM | POA: Diagnosis not present

## 2021-04-20 DIAGNOSIS — K2970 Gastritis, unspecified, without bleeding: Secondary | ICD-10-CM | POA: Diagnosis not present

## 2021-04-20 DIAGNOSIS — I4891 Unspecified atrial fibrillation: Secondary | ICD-10-CM | POA: Diagnosis not present

## 2021-04-20 DIAGNOSIS — Z7901 Long term (current) use of anticoagulants: Secondary | ICD-10-CM | POA: Diagnosis not present

## 2021-04-20 DIAGNOSIS — Z79899 Other long term (current) drug therapy: Secondary | ICD-10-CM | POA: Diagnosis not present

## 2021-04-20 DIAGNOSIS — E785 Hyperlipidemia, unspecified: Secondary | ICD-10-CM | POA: Diagnosis not present

## 2021-04-20 LAB — RESP PANEL BY RT-PCR (FLU A&B, COVID) ARPGX2
Influenza A by PCR: NEGATIVE
Influenza B by PCR: NEGATIVE
SARS Coronavirus 2 by RT PCR: NEGATIVE

## 2021-04-21 ENCOUNTER — Ambulatory Visit
Admission: RE | Admit: 2021-04-21 | Discharge: 2021-04-21 | Disposition: A | Discharge status: Home / Self Care | Payer: BC Managed Care – PPO | Attending: General Surgery | Admitting: General Surgery

## 2021-04-21 ENCOUNTER — Other Ambulatory Visit: Payer: Self-pay

## 2021-04-21 ENCOUNTER — Encounter
Admission: RE | Disposition: A | Discharge status: Home / Self Care | Payer: Self-pay | Source: Home / Self Care | Attending: General Surgery

## 2021-04-21 ENCOUNTER — Inpatient Hospital Stay: Payer: BC Managed Care – PPO | Admitting: Urgent Care

## 2021-04-21 ENCOUNTER — Encounter: Payer: Self-pay | Admitting: General Surgery

## 2021-04-21 DIAGNOSIS — K2970 Gastritis, unspecified, without bleeding: Secondary | ICD-10-CM | POA: Insufficient documentation

## 2021-04-21 DIAGNOSIS — C181 Malignant neoplasm of appendix: Secondary | ICD-10-CM | POA: Insufficient documentation

## 2021-04-21 DIAGNOSIS — Z7901 Long term (current) use of anticoagulants: Secondary | ICD-10-CM | POA: Insufficient documentation

## 2021-04-21 DIAGNOSIS — I10 Essential (primary) hypertension: Secondary | ICD-10-CM | POA: Insufficient documentation

## 2021-04-21 DIAGNOSIS — Z20822 Contact with and (suspected) exposure to covid-19: Secondary | ICD-10-CM | POA: Insufficient documentation

## 2021-04-21 DIAGNOSIS — K219 Gastro-esophageal reflux disease without esophagitis: Secondary | ICD-10-CM | POA: Insufficient documentation

## 2021-04-21 DIAGNOSIS — E785 Hyperlipidemia, unspecified: Secondary | ICD-10-CM | POA: Insufficient documentation

## 2021-04-21 DIAGNOSIS — I700 Atherosclerosis of aorta: Secondary | ICD-10-CM | POA: Insufficient documentation

## 2021-04-21 DIAGNOSIS — Z79899 Other long term (current) drug therapy: Secondary | ICD-10-CM | POA: Insufficient documentation

## 2021-04-21 DIAGNOSIS — Z419 Encounter for procedure for purposes other than remedying health state, unspecified: Secondary | ICD-10-CM

## 2021-04-21 DIAGNOSIS — I4891 Unspecified atrial fibrillation: Secondary | ICD-10-CM | POA: Insufficient documentation

## 2021-04-21 HISTORY — PX: XI ROBOTIC LAPAROSCOPIC ASSISTED APPENDECTOMY: SHX6877

## 2021-04-21 HISTORY — DX: Malignant neoplasm of appendix: C18.1

## 2021-04-21 LAB — TYPE AND SCREEN
ABO/RH(D): A NEG
Antibody Screen: NEGATIVE

## 2021-04-21 LAB — ABO/RH: ABO/RH(D): A NEG

## 2021-04-21 SURGERY — APPENDECTOMY, ROBOT-ASSISTED, LAPAROSCOPIC
Anesthesia: General | Laterality: Right

## 2021-04-21 MED ORDER — BUPIVACAINE LIPOSOME 1.3 % IJ SUSP
INTRAMUSCULAR | Status: DC | PRN
  Administered 2021-04-21: 20 mL

## 2021-04-21 MED ORDER — ROCURONIUM BROMIDE 100 MG/10ML IV SOLN
INTRAVENOUS | Status: DC | PRN
  Administered 2021-04-21: 10 mg via INTRAVENOUS
  Administered 2021-04-21: 40 mg via INTRAVENOUS
  Administered 2021-04-21: 10 mg via INTRAVENOUS
  Administered 2021-04-21 (×2): 5 mg via INTRAVENOUS

## 2021-04-21 MED ORDER — DEXAMETHASONE SODIUM PHOSPHATE 10 MG/ML IJ SOLN
INTRAMUSCULAR | Status: DC | PRN
  Administered 2021-04-21: 5 mg via INTRAVENOUS

## 2021-04-21 MED ORDER — BUPIVACAINE HCL (PF) 0.5 % IJ SOLN
INTRAMUSCULAR | Status: DC | PRN
  Administered 2021-04-21: 30 mL

## 2021-04-21 MED ORDER — MIDAZOLAM HCL 2 MG/2ML IJ SOLN
INTRAMUSCULAR | Status: AC
  Filled 2021-04-21: qty 2

## 2021-04-21 MED ORDER — PROPOFOL 500 MG/50ML IV EMUL
INTRAVENOUS | Status: AC
  Filled 2021-04-21: qty 50

## 2021-04-21 MED ORDER — SODIUM CHLORIDE FLUSH 0.9 % IV SOLN
INTRAVENOUS | Status: AC
  Filled 2021-04-21: qty 10

## 2021-04-21 MED ORDER — ORAL CARE MOUTH RINSE: 15.0000 mL | Freq: Once | OROMUCOSAL | Status: AC

## 2021-04-21 MED ORDER — ROCURONIUM BROMIDE 10 MG/ML (PF) SYRINGE
PREFILLED_SYRINGE | INTRAVENOUS | Status: AC
  Filled 2021-04-21: qty 10

## 2021-04-21 MED ORDER — FENTANYL CITRATE (PF) 100 MCG/2ML IJ SOLN
INTRAMUSCULAR | Status: AC
  Administered 2021-04-21: 50 ug via INTRAVENOUS
  Filled 2021-04-21: qty 2

## 2021-04-21 MED ORDER — CHLORHEXIDINE GLUCONATE 0.12 % MT SOLN: 15.0000 mL | Freq: Once | OROMUCOSAL | Status: AC

## 2021-04-21 MED ORDER — OXYCODONE HCL 5 MG PO TABS: 5.0000 mg | ORAL_TABLET | Freq: Once | ORAL | Status: AC | PRN

## 2021-04-21 MED ORDER — ONDANSETRON HCL 4 MG/2ML IJ SOLN
INTRAMUSCULAR | Status: AC
  Filled 2021-04-21: qty 2

## 2021-04-21 MED ORDER — OXYCODONE HCL 5 MG/5ML PO SOLN: 5.0000 mg | Freq: Once | ORAL | Status: AC | PRN

## 2021-04-21 MED ORDER — FENTANYL CITRATE (PF) 100 MCG/2ML IJ SOLN
INTRAMUSCULAR | Status: AC
  Filled 2021-04-21: qty 2

## 2021-04-21 MED ORDER — BUPIVACAINE HCL (PF) 0.5 % IJ SOLN
INTRAMUSCULAR | Status: AC
  Filled 2021-04-21: qty 30

## 2021-04-21 MED ORDER — ACETAMINOPHEN 10 MG/ML IV SOLN
INTRAVENOUS | Status: AC
  Filled 2021-04-21: qty 100

## 2021-04-21 MED ORDER — 0.9 % SODIUM CHLORIDE (POUR BTL) OPTIME
TOPICAL | Status: DC | PRN
  Administered 2021-04-21: 500 mL

## 2021-04-21 MED ORDER — OXYCODONE HCL 5 MG PO TABS
ORAL_TABLET | ORAL | Status: AC
  Administered 2021-04-21: 5 mg via ORAL
  Filled 2021-04-21: qty 1

## 2021-04-21 MED ORDER — LIDOCAINE HCL (CARDIAC) PF 100 MG/5ML IV SOSY
PREFILLED_SYRINGE | INTRAVENOUS | Status: DC | PRN
  Administered 2021-04-21: 80 mg via INTRAVENOUS

## 2021-04-21 MED ORDER — HEMOSTATIC AGENTS (NO CHARGE) OPTIME: TOPICAL | Status: DC | PRN

## 2021-04-21 MED ORDER — HYDROCODONE-ACETAMINOPHEN 5-325 MG PO TABS: 1.0000 | ORAL_TABLET | ORAL | 0 refills | Status: AC | PRN

## 2021-04-21 MED ORDER — CEFAZOLIN SODIUM-DEXTROSE 2-4 GM/100ML-% IV SOLN
INTRAVENOUS | Status: AC
  Filled 2021-04-21: qty 100

## 2021-04-21 MED ORDER — MEPERIDINE HCL 25 MG/ML IJ SOLN: 6.2500 mg | INTRAMUSCULAR | Status: DC | PRN

## 2021-04-21 MED ORDER — ONDANSETRON HCL 4 MG/2ML IJ SOLN
4.0000 mg | Freq: Once | INTRAMUSCULAR | Status: AC
  Administered 2021-04-21: 4 mg via INTRAVENOUS

## 2021-04-21 MED ORDER — BUPIVACAINE LIPOSOME 1.3 % IJ SUSP
INTRAMUSCULAR | Status: AC
  Filled 2021-04-21: qty 20

## 2021-04-21 MED ORDER — APREPITANT 40 MG PO CAPS: 40.0000 mg | ORAL_CAPSULE | Freq: Once | ORAL | Status: AC

## 2021-04-21 MED ORDER — ACETAMINOPHEN 10 MG/ML IV SOLN
INTRAVENOUS | Status: DC | PRN
  Administered 2021-04-21: 1000 mg via INTRAVENOUS

## 2021-04-21 MED ORDER — FENTANYL CITRATE (PF) 100 MCG/2ML IJ SOLN
25.0000 ug | INTRAMUSCULAR | Status: DC | PRN
  Administered 2021-04-21: 50 ug via INTRAVENOUS

## 2021-04-21 MED ORDER — CEFAZOLIN SODIUM-DEXTROSE 2-4 GM/100ML-% IV SOLN
2.0000 g | INTRAVENOUS | Status: AC
  Administered 2021-04-21: 2 g via INTRAVENOUS

## 2021-04-21 MED ORDER — LACTATED RINGERS IV SOLN: INTRAVENOUS | Status: DC

## 2021-04-21 MED ORDER — APREPITANT 40 MG PO CAPS
ORAL_CAPSULE | ORAL | Status: AC
  Administered 2021-04-21: 40 mg via ORAL
  Filled 2021-04-21: qty 1

## 2021-04-21 MED ORDER — DEXAMETHASONE SODIUM PHOSPHATE 10 MG/ML IJ SOLN
INTRAMUSCULAR | Status: AC
  Filled 2021-04-21: qty 1

## 2021-04-21 MED ORDER — PROMETHAZINE HCL 25 MG/ML IJ SOLN: 6.2500 mg | INTRAMUSCULAR | Status: DC | PRN

## 2021-04-21 MED ORDER — FENTANYL CITRATE (PF) 100 MCG/2ML IJ SOLN
INTRAMUSCULAR | Status: DC | PRN
  Administered 2021-04-21 (×2): 50 ug via INTRAVENOUS
  Administered 2021-04-21: 25 ug via INTRAVENOUS
  Administered 2021-04-21: 50 ug via INTRAVENOUS

## 2021-04-21 MED ORDER — ONDANSETRON HCL 4 MG/2ML IJ SOLN
INTRAMUSCULAR | Status: DC | PRN
  Administered 2021-04-21: 4 mg via INTRAVENOUS

## 2021-04-21 MED ORDER — CHLORHEXIDINE GLUCONATE 0.12 % MT SOLN
OROMUCOSAL | Status: AC
  Administered 2021-04-21: 15 mL via OROMUCOSAL
  Filled 2021-04-21: qty 15

## 2021-04-21 MED ORDER — PROPOFOL 500 MG/50ML IV EMUL
INTRAVENOUS | Status: DC | PRN
  Administered 2021-04-21: 125 ug/kg/min via INTRAVENOUS

## 2021-04-21 MED ORDER — LIDOCAINE HCL (PF) 2 % IJ SOLN
INTRAMUSCULAR | Status: AC
  Filled 2021-04-21: qty 5

## 2021-04-21 MED ORDER — SUGAMMADEX SODIUM 200 MG/2ML IV SOLN
INTRAVENOUS | Status: DC | PRN
  Administered 2021-04-21: 25 mg via INTRAVENOUS
  Administered 2021-04-21: 150 mg via INTRAVENOUS
  Administered 2021-04-21: 25 mg via INTRAVENOUS

## 2021-04-21 MED ORDER — MIDAZOLAM HCL 2 MG/2ML IJ SOLN
INTRAMUSCULAR | Status: DC | PRN
  Administered 2021-04-21: 2 mg via INTRAVENOUS

## 2021-04-21 MED ORDER — PROPOFOL 10 MG/ML IV BOLUS
INTRAVENOUS | Status: DC | PRN
  Administered 2021-04-21: 150 mg via INTRAVENOUS

## 2021-04-21 SURGICAL SUPPLY — 58 items
ADH SKN CLS APL DERMABOND .7 (GAUZE/BANDAGES/DRESSINGS) ×2
APL LAPSCP 35 DL APL RGD (MISCELLANEOUS) ×2
APL PRP STRL LF DISP 70% ISPRP (MISCELLANEOUS) ×2
APPLICATOR VISTASEAL 35 (MISCELLANEOUS) ×3 IMPLANT
BAG SPEC RTRVL LRG 6X4 10 (ENDOMECHANICALS) ×2
BLADE SURG SZ11 CARB STEEL (BLADE) ×3 IMPLANT
CANNULA REDUC XI 12-8 STAPL (CANNULA) ×1
CANNULA REDUCER 12-8 DVNC XI (CANNULA) ×2 IMPLANT
CHLORAPREP W/TINT 26 (MISCELLANEOUS) ×3 IMPLANT
DEFOGGER SCOPE WARMER CLEARIFY (MISCELLANEOUS) ×3 IMPLANT
DERMABOND ADVANCED (GAUZE/BANDAGES/DRESSINGS) ×1
DERMABOND ADVANCED .7 DNX12 (GAUZE/BANDAGES/DRESSINGS) ×2 IMPLANT
DRAPE ARM DVNC X/XI (DISPOSABLE) ×8 IMPLANT
DRAPE COLUMN DVNC XI (DISPOSABLE) ×2 IMPLANT
DRAPE DA VINCI XI ARM (DISPOSABLE) ×4
DRAPE DA VINCI XI COLUMN (DISPOSABLE) ×1
ELECT CAUTERY BLADE 6.4 (BLADE) ×3 IMPLANT
ELECT REM PT RETURN 9FT ADLT (ELECTROSURGICAL) ×3
ELECTRODE REM PT RTRN 9FT ADLT (ELECTROSURGICAL) ×2 IMPLANT
GAUZE 4X4 16PLY ~~LOC~~+RFID DBL (SPONGE) ×3 IMPLANT
GLOVE SURG ENC MOIS LTX SZ6.5 (GLOVE) ×9 IMPLANT
GLOVE SURG UNDER POLY LF SZ6.5 (GLOVE) ×9 IMPLANT
GOWN STRL REUS W/ TWL LRG LVL3 (GOWN DISPOSABLE) ×8 IMPLANT
GOWN STRL REUS W/TWL LRG LVL3 (GOWN DISPOSABLE) ×12
GRASPER SUT TROCAR 14GX15 (MISCELLANEOUS) ×3 IMPLANT
KIT IMAGING PINPOINTPAQ (MISCELLANEOUS) ×3 IMPLANT
KIT PINK PAD W/HEAD ARE REST (MISCELLANEOUS) ×3
KIT PINK PAD W/HEAD ARM REST (MISCELLANEOUS) ×2 IMPLANT
LABEL OR SOLS (LABEL) ×3 IMPLANT
MANIFOLD NEPTUNE II (INSTRUMENTS) ×3 IMPLANT
NEEDLE HYPO 22GX1.5 SAFETY (NEEDLE) ×3 IMPLANT
NEEDLE INSUFFLATION 14GA 120MM (NEEDLE) ×3 IMPLANT
OBTURATOR OPTICAL STANDARD 8MM (TROCAR) ×1
OBTURATOR OPTICAL STND 8 DVNC (TROCAR) ×2
OBTURATOR OPTICALSTD 8 DVNC (TROCAR) ×2 IMPLANT
PACK LAP CHOLECYSTECTOMY (MISCELLANEOUS) ×3 IMPLANT
PENCIL ELECTRO HAND CTR (MISCELLANEOUS) ×3 IMPLANT
POUCH SPECIMEN RETRIEVAL 10MM (ENDOMECHANICALS) ×3 IMPLANT
RELOAD STAPLER 3.5X60 BLU DVNC (STAPLE) ×4 IMPLANT
SEAL CANN UNIV 5-8 DVNC XI (MISCELLANEOUS) ×6 IMPLANT
SEAL XI 5MM-8MM UNIVERSAL (MISCELLANEOUS) ×3
SEALER VESSEL DA VINCI XI (MISCELLANEOUS) ×1
SEALER VESSEL EXT DVNC XI (MISCELLANEOUS) ×2 IMPLANT
SOLUTION ELECTROLUBE (MISCELLANEOUS) ×3 IMPLANT
STAPLER 60 DA VINCI SURE FORM (STAPLE) ×1
STAPLER 60 SUREFORM DVNC (STAPLE) ×2 IMPLANT
STAPLER CANNULA SEAL DVNC XI (STAPLE) ×2 IMPLANT
STAPLER CANNULA SEAL XI (STAPLE) ×1
STAPLER RELOAD 3.5X60 BLU DVNC (STAPLE) ×4
STAPLER RELOAD 3.5X60 BLUE (STAPLE) ×2
SUT MNCRL AB 4-0 PS2 18 (SUTURE) ×6 IMPLANT
SUT VIC AB 3-0 SH 27 (SUTURE) ×3
SUT VIC AB 3-0 SH 27X BRD (SUTURE) ×2 IMPLANT
SUT VICRYL 0 AB UR-6 (SUTURE) ×3 IMPLANT
SUT VLOC 90 6 CV-15 VIOLET (SUTURE) ×3 IMPLANT
SYR 30ML LL (SYRINGE) ×6 IMPLANT
TRAY FOLEY MTR SLVR 16FR STAT (SET/KITS/TRAYS/PACK) ×3 IMPLANT
WATER STERILE IRR 500ML POUR (IV SOLUTION) ×3 IMPLANT

## 2021-04-21 NOTE — Op Note (Signed)
Pre-op Diagnosis: Mucocele of appendix   Post op Diagnosis: Same  Procedure: Robotic assisted laparoscopic appendectomy with partial cecectomy.  Anesthesia: GETA  Surgeon: Herbert Pun, MD, FACS  Wound Classification: clean contaminated  Specimen: Appendix and cecum  Complications: None  Estimated Blood Loss: 3 mL   Indications: Patient is a 60 y.o. female  with large appendix mass concerning of mucocele. Appendectomy indicated to rule out malignancy.   FIndings: 1.  Ihuge appendix. 2. Mucocele extended to the base of cecum.  3. Normal anatomy 4. Adequate hemostasis.  5. No other gross pathology or implants identified.             Description of procedure: The patient was placed on the operating table in the supine position. General anesthesia was induced. A time-out was completed verifying correct patient, procedure, site, positioning, and implant(s) and/or special equipment prior to beginning this procedure. The abdomen was prepped and draped in the usual sterile fashion.   Palmer's point located and Veress needle was inserted.  After confirming 2 clicks and a positive saline drop test, gas insufflation was initiated until the abdominal pressure was measured at 15 mmHg.  Afterwards, the Veress needle was removed and a 8 mm port was placed in left upper quadrant area using Optiview technique.  After local was infused, 3 additional incision on the left abdominal wall were made 5 cm apart.  An 12 mm port and two other 8 mm ports were placed under direct visualization.  No injuries from trocar placements were noted.  The table was placed in the Trendelenburg position with the right side elevated.  With the use of Tip up grasper, Fenestrated Bipolar and Vessel sealer, an huge appendix was identified and elevated.  The appendix was never grasped.  Imaging was manipulated through the cecum more mesentery.  With the vessel sealer the mesentery of the appendix was divided to  the cecum.  That way I was able to mobilize the cecum.  The mucocele of the appendix was identified involving the base of the cecum.  This made the case very challenging to be able to mobilize the cecum was time-consuming.  Once the cecum was able to be mobilized I was able to find just the perfect amount of space between the mucocele and the ileocecal valve.  I was able to divide the cecum with 2 loads of linear stapler.  The staple line was reinforced with Lembert suture using a 3-0 V lock.  No other pathology was identified within pelvis or the liver.  There was no peritoneal implants.  There was no spillage of the mucocele. The 12 mm trocar removed and port site incision was extended to be able to remove the large appendix without rupture.  This incision was closed with multiple 0 Vicryl sutures.  Remaining trocars were removed under direct vision. No bleeding was noted.The abdomen was allowed to collapse.  All skin incisions then closed with subcuticular sutures Monocryl 4-0.  Wounds then dressed with dermabond.  The patient tolerated the procedure well, awakened from anesthesia and was taken to the postanesthesia care unit in satisfactory condition.  Sponge count and instrument count correct at the end of the procedure.

## 2021-04-21 NOTE — Anesthesia Postprocedure Evaluation (Signed)
Anesthesia Post Note  Patient: Barbara Johnston  Procedure(s) Performed: XI ROBOT ASSISTED LAPAROSCOPIC APPENDECTOMY  Patient location during evaluation: PACU Anesthesia Type: General Level of consciousness: awake and alert, oriented and patient cooperative Pain management: pain level controlled Vital Signs Assessment: post-procedure vital signs reviewed and stable Respiratory status: spontaneous breathing, nonlabored ventilation and respiratory function stable Cardiovascular status: blood pressure returned to baseline and stable Postop Assessment: adequate PO intake Anesthetic complications: no   No notable events documented.   Last Vitals:  Vitals:   04/21/21 1400 04/21/21 1415  BP: (!) 139/57 (!) 124/59  Pulse: 63 63  Resp: 10 (!) 8  Temp:    SpO2: 94% 99%    Last Pain:  Vitals:   04/21/21 1345  TempSrc:   PainSc: [place]

## 2021-04-21 NOTE — H&P (Signed)
PATIENT PROFILE: Barbara Johnston is a 60 y.o. female who presents to the OR for surgical management of mucocele of appendix. No change on history or symptoms since last evaluation.   PCP: Barbara Pax, MD  HISTORY OF PRESENT ILLNESS: Barbara Johnston reports she had her usual screening colonoscopy, 10 year interval. She was found with small polyp of the rectum, negative for malignancy. She was also found with bulging tissue from appendix orifice. This led to CT scan of the abdomen and pelvis that showed a severely enlarged appendix with calcifications. I personally evaluated the images.   Patient endorses that she has been having lower abdominal pain that she feels like gas pain. No localized pain on the right lower quadrant. Pain does not radiates to a specific area of the body. No alleviating factors. Aggravating factor is oral intake.   Patient denies weight loss, endorses minimal rectal bleeding. Denies family history of colon cancer.   PROBLEM LIST: Problem List Date Reviewed: 11/30/2020  Noted  Status post radiofrequency ablation for arrhythmia Unknown  Chronic venous insufficiency 01/30/2019  GERD (gastroesophageal reflux disease) 01/30/2019  Vitamin D deficiency 11/09/2016  Overview  19, 2010 26, 2020   Benign essential HTN 08/27/2014  PAF (paroxysmal atrial fibrillation) (CMS-HCC) Unknown  Overview  s/p ablation 2009 on dofetilide reAblation 9/21, cardiac MR totally normal Patient wants to stay on low-dose Eliquis   PVC (premature ventricular contraction) 03/29/2013  Hyperlipemia, mixed 03/29/2013  Overview  Simvastatin changed to Lipitor 11/19     GENERAL REVIEW OF SYSTEMS:   General ROS: negative for - chills, fatigue, fever, weight gain or weight loss Allergy and Immunology ROS: negative for - hives  Hematological and Lymphatic ROS: negative for - bleeding problems or bruising, negative for palpable nodes Endocrine ROS: negative for - heat or cold  intolerance, hair changes Respiratory ROS: negative for - cough, shortness of breath or wheezing Cardiovascular ROS: no chest pain or palpitations GI ROS: negative for nausea, vomiting, positive for abdominal pain Musculoskeletal ROS: negative for - joint swelling or muscle pain Neurological ROS: negative for - confusion, syncope Dermatological ROS: negative for pruritus and rash Psychiatric: negative for anxiety, depression, difficulty sleeping and memory loss  MEDICATIONS: Current Outpatient Medications  Medication Sig Dispense Refill   acetaminophen (TYLENOL) 325 MG tablet Take 650 mg by mouth every 4 (four) hours as needed for Pain   apixaban (ELIQUIS) 2.5 mg tablet Take 1 tablet (2.5 mg total) by mouth 2 (two) times daily 180 tablet 3   atorvastatin (LIPITOR) 20 MG tablet Take 20 mg by mouth once daily   black cohosh 20 mg Tab Take by mouth 2 (two) times daily.   calcium carbonate-vitamin D3 (CALTRATE 600+D) 600 mg(1,554m) -200 unit tablet Take 1 tablet by mouth once daily.   docusate (COLACE) 100 MG capsule Take 100 mg by mouth once daily   FUROsemide (LASIX) 20 MG tablet Take 1 tablet (20 mg total) by mouth once daily 90 tablet 3   Herbal Supplement Herbal Name: Pro biotic   lysine 500 mg Cap Take by mouth once daily.   magnesium oxide (MAG-OX) 400 mg tablet Take 800 mg by mouth once daily.   multivitamin tablet Take 1 tablet by mouth daily.   omeprazole (PRILOSEC) 20 MG DR capsule Take 1 capsule (20 mg total) by mouth once daily For 30 days post-ablation, then on 03/28/2020, resume previous dosing of omeprazole 268mby mouth once daily 60 capsule 0   potassium chloride (KLOR-CON) 10 MEQ ER  tablet TAKE 1 TABLET(10 MEQ) BY MOUTH EVERY DAY 90 tablet 1   vitamin E 400 UNIT capsule Take 400 Units by mouth once daily.   No current facility-administered medications for this visit.   ALLERGIES: Codeine  PAST MEDICAL HISTORY: Past Medical History:  Diagnosis Date   A-fib (CMS-HCC)   s/p ablation 2009 on dofetilide   Arrhythmia 2010  Heart Afib   Chickenpox   GERD (gastroesophageal reflux disease)   History of abnormal cervical Pap smear 2009   History of gastritis   History of gastrointestinal ulcer   HTN (hypertension)   Hyperlipidemia   Motion sickness   Obesity   PONV (postoperative nausea and vomiting)  N/V   PAST SURGICAL HISTORY: Past Surgical History:  Procedure Laterality Date   Cardiac ablation 05/2008   CHOLECYSTECTOMY 05/2004   CHOLECYSTECTOMY 2010   COLONOSCOPY 03/01/2021  Tubular adenoma/Hyperplastic polyp/Repeat 54yr/SMR   HYSTERECTOMY 2007 or 2006?   Supracervical hysterectomy with bilateral oophorectomy 2007   Tonsillectomy   TONSILLECTOMY    FAMILY HISTORY: Family History  Problem Relation Age of Onset   High blood pressure (Hypertension) Mother   Hyperlipidemia (Elevated cholesterol) Mother   Myocardial Infarction (Heart attack) Father   Hypothyroidism Father   Hyperlipidemia (Elevated cholesterol) Father   Atrial fibrillation (Abnormal heart rhythm sometimes requiring treatment with blood thinners) Sister   Anesthesia problems Neg Hx    SOCIAL HISTORY: Social History   Socioeconomic History   Marital status: Married  Tobacco Use   Smoking status: Never Smoker   Smokeless tobacco: Never Used  VScientific laboratory technicianUse: Never used  Substance and Sexual Activity   Alcohol use: Not Currently  Alcohol/week: 0.0 standard drinks   Drug use: No   Sexual activity: Yes  Partners: Male  Birth control/protection: None   PHYSICAL EXAM: Vitals:  04/09/21 0900  BP: (!) 140/82  Pulse: 81   Body mass index is 33.82 kg/m. Weight: 81.2 kg (179 lb)   GENERAL: Alert, active, oriented x3  HEENT: Pupils equal reactive to light. Extraocular movements are intact. Sclera clear. Palpebral conjunctiva normal red color.Pharynx clear.  NECK: Supple with no palpable mass and no adenopathy.  LUNGS: Sound clear with no rales rhonchi  or wheezes.  HEART: Regular rhythm S1 and S2 without murmur.  ABDOMEN: Soft and depressible, nontender with no palpable mass, no hepatomegaly.   EXTREMITIES: Well-developed well-nourished symmetrical with no dependent edema.  NEUROLOGICAL: Awake alert oriented, facial expression symmetrical, moving all extremities.  REVIEW OF DATA: I have reviewed the following data today: Appointment on 02/08/2021  Component Date Value   WBC (White Blood Cell Co* 02/08/2021 5.7   RBC (Red Blood Cell Coun* 02/08/2021 4.92   Hemoglobin 02/08/2021 14.8   Hematocrit 02/08/2021 44.7   MCV (Mean Corpuscular Vo* 02/08/2021 90.9   MCH (Mean Corpuscular He* 02/08/2021 30.1   MCHC (Mean Corpuscular H* 02/08/2021 33.1   Platelet Count 02/08/2021 190   RDW-CV (Red Cell Distrib* 02/08/2021 12.1   MPV (Mean Platelet Volum* 02/08/2021 10.6   Neutrophils 02/08/2021 3.15   Lymphocytes 02/08/2021 1.83   Monocytes 02/08/2021 0.42   Eosinophils 02/08/2021 0.22   Basophils 02/08/2021 0.03   Neutrophil % 02/08/2021 55.7   Lymphocyte % 02/08/2021 32.3   Monocyte % 02/08/2021 7.4   Eosinophil % 02/08/2021 3.9   Basophil% 02/08/2021 0.5   Immature Granulocyte % 02/08/2021 0.2   Immature Granulocyte Cou* 02/08/2021 0.01   Glucose 02/08/2021 105   Sodium 02/08/2021 140   Potassium 02/08/2021  4.2   Chloride 02/08/2021 104   Carbon Dioxide (CO2) 02/08/2021 30.8   Urea Nitrogen (BUN) 02/08/2021 21   Creatinine 02/08/2021 0.7   Glomerular Filtration Ra* 02/08/2021 85   Calcium 02/08/2021 9.2   AST 02/08/2021 19   ALT 02/08/2021 15   Alk Phos (alkaline Phosp* 02/08/2021 54   Albumin 02/08/2021 4.4   Bilirubin, Total 02/08/2021 1.1   Protein, Total 02/08/2021 6.6   A/G Ratio 02/08/2021 2.0   Hemoglobin A1C 02/08/2021 5.9 (!)   Average Blood Glucose (C* 02/08/2021 123   Cholesterol, Total 02/08/2021 149   Triglyceride 02/08/2021 189   HDL (High Density Lipopr* 02/08/2021 27.0 (!)   LDL Calculated 02/08/2021  84   VLDL Cholesterol 02/08/2021 38   Cholesterol/HDL Ratio 02/08/2021 5.5   Color 02/08/2021 Yellow   Clarity 02/08/2021 Clear   Specific Gravity 02/08/2021 1.010   pH, Urine 02/08/2021 6.0   Protein, Urinalysis 02/08/2021 Negative   Glucose, Urinalysis 02/08/2021 Negative   Ketones, Urinalysis 02/08/2021 Negative   Blood, Urinalysis 02/08/2021 Negative   Nitrite, Urinalysis 02/08/2021 Negative   Leukocyte Esterase, Urin* 02/08/2021 Small (!)   White Blood Cells, Urina* 02/08/2021 4-10 (!)   Red Blood Cells, Urinaly* 02/08/2021 None Seen   Bacteria, Urinalysis 02/08/2021 Rare (!)   Squamous Epithelial Cell* 02/08/2021 Rare   Thyroid Stimulating Horm* 02/08/2021 2.282   Vitamin B12 02/08/2021 370    ASSESSMENT: Ms. Cunning is a 60 y.o. female presenting for consultation for mucocele of appendix.  Patient oriented about colonoscopy and CT scan finding suggestive of mucocele of appendix. On imaging, the appendix is extremely enlarged. I think that I will have a good tissue between appendix and terminal ileum to remove the appendix with partial cecectomy. I did discuss with patient the possibility of needing a right colectomy if there is no enough good tissue. If the ileocecal valve is involved, I will proceed to right colectomy. Patient oriented about both surgeries (Appendectomy and right colectomy), the minimally invasive approach, the benefits and risks of surgery and the recovery. Patient endorses that she understand and agreed to proceed with plan.   Due to her history of A fib and anticoagulation patient at higher risk of bleeding. Will contact PCP who is managing her anticoagulation for medical clearance and to hold Eliquis for 2 days.   I discuss the case with Pathologist and frozen section will not be possible. This would have help to make decision of just appendectomy vs right colectomy on same surgery. Patient oriented that if pathology returns positive for malignancy, a second  surgery for a right colectomy will be indicated.   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 Robotic assisted laparoscopic appendectomy vs right colectomy ([id number], [id number])   Patient verbalized understanding, all questions were answered, and were agreeable with the plan outlined above.   I spent a total of 90 minutes in both face-to-face and non-face-to-face activities for this visit on the date of this encounter.  Herbert Pun, MD  Electronically signed by Herbert Pun, MD

## 2021-04-21 NOTE — Anesthesia Preprocedure Evaluation (Signed)
Anesthesia Evaluation  Patient identified by MRN, date of birth, ID band Patient awake    Reviewed: Allergy & Precautions, NPO status , Patient's Chart, lab work & pertinent test results  History of Anesthesia Complications (+) PONV and history of anesthetic complications  Airway Mallampati: II  TM Distance: >3 FB Neck ROM: Full    Dental no notable dental hx.    Pulmonary neg sleep apnea, neg COPD,    breath sounds clear to auscultation- rhonchi (-) wheezing      Cardiovascular hypertension, Pt. 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 No date: Atrial fibrillation ([facility])     Comment:  a.) CHA2DS2-VASc = 3 (sex, HTN, aortic plaque). b.)               rate/ryhthm maintained on metoprolol; chronically               anticoagulated with reduced dose apixaban. c.) Ablation               05/08/2008. d.) DCCV 01/21/2020. e.) Repeat cardiac               ablation 02/26/2020. No date: Current use of long term anticoagulation     Comment:  a.) Apixaban No date: Gastritis No date: Gastrointestinal ulcer No date: GERD (gastroesophageal reflux disease) No date: Hyperlipidemia No date:  Hypertension No date: Motion sickness No date: Obesity No date: PONV (postoperative nausea and vomiting)   Reproductive/Obstetrics                             Anesthesia Physical Anesthesia Plan  ASA: 3  Anesthesia Plan: General   Post-op Pain Management:    Induction: Intravenous  PONV Risk Score and Plan: 3 and Ondansetron, Aprepitant, Dexamethasone, TIVA and Midazolam  Airway Management Planned: Oral ETT  Additional Equipment:   Intra-op Plan:   Post-operative Plan: Extubation in OR  Informed Consent: I have reviewed the patients History and Physical, chart, labs and discussed the procedure including the risks, benefits and alternatives for the proposed anesthesia with the patient or authorized representative who has indicated his/her understanding and acceptance.     Dental advisory given  Plan Discussed with: CRNA and Anesthesiologist  Anesthesia Plan Comments:         Anesthesia Quick Evaluation

## 2021-04-21 NOTE — Transfer of Care (Signed)
Immediate Anesthesia Transfer of Care Note  Patient: Barbara Johnston  Procedure(s) Performed: XI ROBOT ASSISTED LAPAROSCOPIC APPENDECTOMY  Patient Location: PACU  Anesthesia Type:General  Level of Consciousness: awake and alert   Airway & Oxygen Therapy: Patient Spontanous Breathing  Post-op Assessment: Report given to RN and Post -op Vital signs reviewed and stable  Post vital signs: Reviewed and stable  Last Vitals:  Vitals Value Taken Time  BP 132/60 04/21/21 1315  Temp 36.7 C 04/21/21 1313  Pulse 55 04/21/21 1323  Resp 17 04/21/21 1323  SpO2 95 % 04/21/21 1323  Vitals shown include unvalidated device data.  Last Pain:  Vitals:   04/21/21 1313  TempSrc:   PainSc: 8          Complications: No notable events documented.

## 2021-04-21 NOTE — Anesthesia Procedure Notes (Addendum)
Procedure Name: Intubation Date/Time: 04/21/2021 11:14 AM Performed by: Justin Mend, RN Pre-anesthesia Checklist: Patient identified, Emergency Drugs available, Suction available and Patient being monitored Patient Re-evaluated:Patient Re-evaluated prior to induction Oxygen Delivery Method: Circle system utilized Preoxygenation: Pre-oxygenation with 100% oxygen Induction Type: IV induction Ventilation: Mask ventilation without difficulty Laryngoscope Size: McGraph and 3 Grade View: Grade I Tube type: Oral Tube size: 7.0 mm Number of attempts: 1 Airway Equipment and Method: Stylet and Oral airway Placement Confirmation: ETT inserted through vocal cords under direct vision, positive ETCO2 and breath sounds checked- equal and bilateral Secured at: 22 cm Tube secured with: Tape Dental Injury: Teeth and Oropharynx as per pre-operative assessment

## 2021-04-21 NOTE — Discharge Instructions (Addendum)

## 2021-04-22 ENCOUNTER — Other Ambulatory Visit: Payer: Self-pay | Admitting: Pathology

## 2021-04-22 ENCOUNTER — Encounter: Payer: Self-pay | Admitting: General Surgery

## 2021-04-22 LAB — SURGICAL PATHOLOGY

## 2021-04-29 ENCOUNTER — Other Ambulatory Visit: Payer: BC Managed Care – PPO

## 2021-04-29 NOTE — Progress Notes (Signed)
Tumor Board Documentation  Barbara Johnston was presented by Dr Windell Moment at our Tumor Board on 04/29/2021, which included representatives from radiation oncology, medical oncology, radiology, pathology, surgical, navigation, internal medicine, genetics, pharmacy, research.  Barbara Johnston currently presents as an external consult, for [place], for new positive pathology with history of the following treatments: surgical intervention(s).  Additionally, we reviewed previous medical and familial history, history of present illness, and recent lab results along with all available histopathologic and imaging studies. The tumor board considered available treatment options and made the following recommendations: Surgery    The following procedures/referrals were also placed: No orders of the defined types were placed in this encounter.   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  Tumor board is a meeting of clinicians from various specialty areas who evaluate and discuss patients for whom a multidisciplinary approach is being considered. Final determinations in the plan of care are those of the provider(s). The responsibility for follow up of recommendations given during tumor board is that of the provider.   Today's extended care, comprehensive team conference, Barbara Johnston was not present for the discussion and was not examined.   Multidisciplinary Tumor Board is a multidisciplinary case peer review process.  Decisions discussed in the Multidisciplinary Tumor Board reflect the opinions of the specialists present at the conference without having examined the patient.  Ultimately, treatment and diagnostic decisions rest with the primary provider(s) and the patient.

## 2021-05-04 ENCOUNTER — Other Ambulatory Visit: Payer: Self-pay | Admitting: General Surgery

## 2021-05-04 ENCOUNTER — Ambulatory Visit: Payer: Self-pay | Admitting: General Surgery

## 2021-05-04 DIAGNOSIS — C181 Malignant neoplasm of appendix: Secondary | ICD-10-CM

## 2021-05-04 NOTE — H&P (View-Only) (Signed)
HISTORY OF PRESENT ILLNESS:    Barbara Johnston is a 60 y.o.female patient who comes for follow-up after appendectomy and discussion of surgery for neoplasm of the appendix.  Patient with a large mucinous neoplasm of the appendix.  She underwent appendectomy.  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  She has been recovering slowly but adequately.  Pain slowly getting better.  She has been tolerating diet.  She denies any issues with the wound.  She denies any chest pain or shortness of breath.      PAST MEDICAL HISTORY:  Past Medical History:  Diagnosis Date   A-fib (CMS-HCC)    s/p ablation 2009 on dofetilide   Arrhythmia 2010   Heart Afib   Chickenpox    GERD (gastroesophageal reflux disease)    History of abnormal cervical Pap smear 2009   History of gastritis    History of gastrointestinal ulcer    HTN (hypertension)    Hyperlipidemia    Motion sickness    Obesity    PONV (postoperative nausea and vomiting)    N/V        PAST SURGICAL HISTORY:   Past Surgical History:  Procedure Laterality Date   APPENDECTOMY  04/21/2021   Dr Lesli Albee --- ROBOTIC   Cardiac ablation  05/2008   CHOLECYSTECTOMY  05/2004   CHOLECYSTECTOMY  2010   COLONOSCOPY  03/01/2021   Tubular adenoma/Hyperplastic polyp/Repeat 47yrs/SMR   HYSTERECTOMY  2007 or 2006?   Supracervical hysterectomy with bilateral oophorectomy  2007   Tonsillectomy     TONSILLECTOMY           MEDICATIONS:  Outpatient Encounter Medications as of 05/04/2021  Medication Sig Dispense Refill   acetaminophen (TYLENOL) 325 MG tablet Take 650 mg by mouth every 4 (four) hours as needed for Pain     apixaban (ELIQUIS) 2.5 mg tablet Take 1 tablet (2.5 mg total) by mouth 2 (two) times daily 180 tablet 3   atorvastatin (LIPITOR) 20 MG tablet Take 20 mg by mouth once daily     black cohosh 20 mg Tab Take by mouth 2 (two) times  daily.     calcium carbonate-vitamin D3 (CALTRATE 600+D) 600 mg(1,500mg ) -200 unit tablet Take 1 tablet by mouth once daily.     docusate (COLACE) 100 MG capsule Take 100 mg by mouth once daily        FUROsemide (LASIX) 20 MG tablet Take 1 tablet (20 mg total) by mouth once daily 90 tablet 3   Herbal Supplement Herbal Name: Pro biotic     lysine 500 mg Cap Take by mouth once daily.     magnesium oxide (MAG-OX) 400 mg tablet Take 800 mg by mouth once daily.     multivitamin tablet Take 1 tablet by mouth daily.     omeprazole (PRILOSEC) 20 MG DR capsule Take 1 capsule (20 mg total) by mouth once daily For 30 days post-ablation, then on 03/28/2020, resume previous dosing of omeprazole 20mg  by mouth once daily 60 capsule 0   potassium chloride (KLOR-CON) 10 MEQ ER tablet Take 1 tablet (10 mEq total) by mouth once daily 90 tablet 3   vitamin E 400 UNIT capsule Take 400 Units by mouth once daily.     ciprofloxacin HCl (CIPRO) 500 MG tablet Take 1 tablet (500 mg total) by mouth 2 (two) times daily for 1 day Take one in the morning and  one at night the day before the surgery 2 tablet 0   neomycin 500 mg tablet Take 2 tablets at 2 pm, 3 pm and 10 pm the day before surgery. 6 tablet 0   No facility-administered encounter medications on file as of 05/04/2021.     ALLERGIES:   Codeine   SOCIAL HISTORY:  Social History   Socioeconomic History   Marital status: Married  Tobacco Use   Smoking status: Never   Smokeless tobacco: Never  Vaping Use   Vaping Use: Never used  Substance and Sexual Activity   Alcohol use: Not Currently    Alcohol/week: 0.0 standard drinks   Drug use: No   Sexual activity: Yes    Partners: Male    Birth control/protection: None    FAMILY HISTORY:  Family History  Problem Relation Age of Onset   High blood pressure (Hypertension) Mother    Hyperlipidemia (Elevated cholesterol) Mother    Myocardial Infarction (Heart attack) Father    Hypothyroidism Father     Hyperlipidemia (Elevated cholesterol) Father    Atrial fibrillation (Abnormal heart rhythm sometimes requiring treatment with blood thinners) Sister    Anesthesia problems Neg Hx      GENERAL REVIEW OF SYSTEMS:   General ROS: negative for - chills, fatigue, fever, weight gain or weight loss Allergy and Immunology ROS: negative for - hives  Hematological and Lymphatic ROS: negative for - bleeding problems or bruising, negative for palpable nodes Endocrine ROS: negative for - heat or cold intolerance, hair changes Respiratory ROS: negative for - cough, shortness of breath or wheezing Cardiovascular ROS: no chest pain or palpitations GI ROS: negative for nausea, vomiting, abdominal pain, diarrhea, constipation Musculoskeletal ROS: negative for - joint swelling or muscle pain Neurological ROS: negative for - confusion, syncope Dermatological ROS: negative for pruritus and rash  PHYSICAL EXAM:  Vitals:   05/04/21 0921  BP: (!) 154/88  Pulse: 77  .  Ht:154.9 cm (5\' 1" ) Wt:83.9 kg (185 lb) WCH:ENID surface area is 1.9 meters squared. Body mass index is 34.96 kg/m.Marland Kitchen   GENERAL: Alert, active, oriented x3  HEENT: Pupils equal reactive to light. Extraocular movements are intact. Sclera clear. Palpebral conjunctiva normal red color.Pharynx clear.  NECK: Supple with no palpable mass and no adenopathy.  LUNGS: Sound clear with no rales rhonchi or wheezes.  HEART: Regular rhythm S1 and S2 without murmur.  ABDOMEN: Soft and depressible, nontender with no palpable mass, no hepatomegaly. Wounds dry and clean.  EXTREMITIES: Well-developed well-nourished symmetrical with no dependent edema.  NEUROLOGICAL: Awake alert oriented, facial expression symmetrical, moving all extremities.    SURGICAL PATHOLOGY  CASE: [id number]  PATIENT: Barbara Johnston  Surgical Pathology Report      Specimen Submitted:  A. Appendix   Clinical History: 60 year old female with mucocele of the appendix.        DIAGNOSIS:  A. APPENDIX; APPENDECTOMY:  - HIGH-GRADE APPENDICEAL MUCINOUS NEOPLASM.  - SEE CANCER SUMMARY BELOW.   CASE SUMMARY: (APPENDIX: Resection)  Standard(s): AJCC-UICC 9   SPECIMEN  Procedure: Appendectomy   TUMOR  Tumor site: Diffusely involving appendix  Histologic type: High-grade appendiceal mucinous neoplasm  Histologic Grade: G2, moderately differentiated  Tumor Size: Greatest dimension: Approximately 10 cm  Tumor Deposits: Not identified  Tumor Extent: Acellular mucin invades visceral peritoneum (serosa)  Lymphovascular Invasion: Not identified   MARGINS  Margin Status for Invasive Carcinoma:       All margins negative for invasive carcinoma  Margin status for Non-invasive Tumor:  All margins negative for non-invasive tumor   REGIONAL LYMPH NODES  Regional Lymph Node Status: Not applicable (no regional lymph nodes  submitted or found)   DISTANT METASTASIS  Distant Site(s) Involved, if applicable: Not applicable   PATHOLOGIC STAGE CLASSIFICATION (pTNM, AJCC 8th Edition):  TNM Descriptors: Not applicable  [id number]  pN - Not assigned (no nodes submitted or found)  pM - Not applicable - pM cannot be determined from the submitted  specimen   Comment:  Acellular mucin is focally present in periappendiceal adipose tissue at  the staple margin.   GROSS DESCRIPTION:  A. Labeled: Appendix  Received: Fresh  Collection time: 12:48 PM on 04/21/2021  Placed into formalin time: 1:16 PM on 04/21/2021  Size: 10.5 cm long by 2.6 cm in diameter with an attached portion of  mesoappendix, 5 x 2.5 x 1.5 cm  External surface: The serosa is white to pink and smooth with distal  areas of lobulation.  The serosa is inked black.  Perforation: None grossly appreciated.  Fecalith: None grossly appreciated.  Description: The proximal margin has a staple line which is removed and  is under inked blue.  The lumen is diffusely dilated, up to 2 cm in  diameter, and  contains tan transparent to white opaque mucinous  contents.  The mucosa is diffusely tan-pink and flattened with distal  lobulated cystlike areas.  The wall thickness ranges from 0.1 to 0.2 cm.   Block summary:  1 - 6 - resection margin, serially sectioned, submitted entirely and  sequentially  7 - 9 - appendiceal tip, serially sectioned, submitted entirely and  sequentially  10 - 20 - appendiceal cross-sections, submitted entirely and  sequentially from appendiceal tip to resection margin   RB 04/21/2021    Final Diagnosis performed by Quay Burow, MD.   Electronically signed  04/22/2021 4:26:45PM  The electronic signature indicates that the named Attending Pathologist  has evaluated the specimen  Technical component performed at [facility], [address], [place],  [place] [address] Lab: [phone] Dir: Rush Farmer, MD, MMM   Professional component performed at 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  She was oriented that due to this neoplasm being T4, high-grade, G2 the recommendation is to proceed with right hemicolectomy for complete staging and decrease recurrence rate.  Patient was oriented about robotic assisted laparoscopic right hemicolectomy.  She was oriented about the benefit of the surgery.  She was also oriented about the risk that includes bleeding, infection, anastomosis leak, bowel obstruction, injury to adjacent organ, among others.  The patient and the husband endorses that they understood and agreed to proceed. -We will order preoperative work-up that includes CEA, CA 19-9, CEA 125 and chest CT scan.  Patient with previous  abdominal pelvic CT scan without evidence of metastatic disease in the abdomen.   PLAN:  Robotic assisted laparoscopic right hemicolectomy (69485) Chest CT scan with IV contrast CEA, C19-9, CA125 CBC, CMP Hold Eliquis 2 days before surgery Bowel prep the day before surgery Take antibiotics as prescribed the day before surgery Contact us if you have any concern.   Patient verbalized understanding, all questions were answered, and were agreeable  with the plan outlined above.   Herbert Pun, MD  Electronically signed by Herbert Pun, MD

## 2021-05-04 NOTE — H&P (Signed)
HISTORY OF PRESENT ILLNESS:    Barbara Johnston is a 60 y.o.female patient who comes for follow-up after appendectomy and discussion of surgery for neoplasm of the appendix.  Patient with a large mucinous neoplasm of the appendix.  She underwent appendectomy.  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  She has been recovering slowly but adequately.  Pain slowly getting better.  She has been tolerating diet.  She denies any issues with the wound.  She denies any chest pain or shortness of breath.      PAST MEDICAL HISTORY:  Past Medical History:  Diagnosis Date   A-fib (CMS-HCC)    s/p ablation 2009 on dofetilide   Arrhythmia 2010   Heart Afib   Chickenpox    GERD (gastroesophageal reflux disease)    History of abnormal cervical Pap smear 2009   History of gastritis    History of gastrointestinal ulcer    HTN (hypertension)    Hyperlipidemia    Motion sickness    Obesity    PONV (postoperative nausea and vomiting)    N/V        PAST SURGICAL HISTORY:   Past Surgical History:  Procedure Laterality Date   APPENDECTOMY  04/21/2021   Dr Lesli Albee --- ROBOTIC   Cardiac ablation  05/2008   CHOLECYSTECTOMY  05/2004   CHOLECYSTECTOMY  2010   COLONOSCOPY  03/01/2021   Tubular adenoma/Hyperplastic polyp/Repeat 74yrs/SMR   HYSTERECTOMY  2007 or 2006?   Supracervical hysterectomy with bilateral oophorectomy  2007   Tonsillectomy     TONSILLECTOMY           MEDICATIONS:  Outpatient Encounter Medications as of 05/04/2021  Medication Sig Dispense Refill   acetaminophen (TYLENOL) 325 MG tablet Take 650 mg by mouth every 4 (four) hours as needed for Pain     apixaban (ELIQUIS) 2.5 mg tablet Take 1 tablet (2.5 mg total) by mouth 2 (two) times daily 180 tablet 3   atorvastatin (LIPITOR) 20 MG tablet Take 20 mg by mouth once daily     black cohosh 20 mg Tab Take by mouth 2 (two) times  daily.     calcium carbonate-vitamin D3 (CALTRATE 600+D) 600 mg(1,500mg ) -200 unit tablet Take 1 tablet by mouth once daily.     docusate (COLACE) 100 MG capsule Take 100 mg by mouth once daily        FUROsemide (LASIX) 20 MG tablet Take 1 tablet (20 mg total) by mouth once daily 90 tablet 3   Herbal Supplement Herbal Name: Pro biotic     lysine 500 mg Cap Take by mouth once daily.     magnesium oxide (MAG-OX) 400 mg tablet Take 800 mg by mouth once daily.     multivitamin tablet Take 1 tablet by mouth daily.     omeprazole (PRILOSEC) 20 MG DR capsule Take 1 capsule (20 mg total) by mouth once daily For 30 days post-ablation, then on 03/28/2020, resume previous dosing of omeprazole 20mg  by mouth once daily 60 capsule 0   potassium chloride (KLOR-CON) 10 MEQ ER tablet Take 1 tablet (10 mEq total) by mouth once daily 90 tablet 3   vitamin E 400 UNIT capsule Take 400 Units by mouth once daily.     ciprofloxacin HCl (CIPRO) 500 MG tablet Take 1 tablet (500 mg total) by mouth 2 (two) times daily for 1 day Take one in the morning and  one at night the day before the surgery 2 tablet 0   neomycin 500 mg tablet Take 2 tablets at 2 pm, 3 pm and 10 pm the day before surgery. 6 tablet 0   No facility-administered encounter medications on file as of 05/04/2021.     ALLERGIES:   Codeine   SOCIAL HISTORY:  Social History   Socioeconomic History   Marital status: Married  Tobacco Use   Smoking status: Never   Smokeless tobacco: Never  Vaping Use   Vaping Use: Never used  Substance and Sexual Activity   Alcohol use: Not Currently    Alcohol/week: 0.0 standard drinks   Drug use: No   Sexual activity: Yes    Partners: Male    Birth control/protection: None    FAMILY HISTORY:  Family History  Problem Relation Age of Onset   High blood pressure (Hypertension) Mother    Hyperlipidemia (Elevated cholesterol) Mother    Myocardial Infarction (Heart attack) Father    Hypothyroidism Father     Hyperlipidemia (Elevated cholesterol) Father    Atrial fibrillation (Abnormal heart rhythm sometimes requiring treatment with blood thinners) Sister    Anesthesia problems Neg Hx      GENERAL REVIEW OF SYSTEMS:   General ROS: negative for - chills, fatigue, fever, weight gain or weight loss Allergy and Immunology ROS: negative for - hives  Hematological and Lymphatic ROS: negative for - bleeding problems or bruising, negative for palpable nodes Endocrine ROS: negative for - heat or cold intolerance, hair changes Respiratory ROS: negative for - cough, shortness of breath or wheezing Cardiovascular ROS: no chest pain or palpitations GI ROS: negative for nausea, vomiting, abdominal pain, diarrhea, constipation Musculoskeletal ROS: negative for - joint swelling or muscle pain Neurological ROS: negative for - confusion, syncope Dermatological ROS: negative for pruritus and rash  PHYSICAL EXAM:  Vitals:   05/04/21 0921  BP: (!) 154/88  Pulse: 77  .  Ht:154.9 cm (5\' 1" ) Wt:83.9 kg (185 lb) TJQ:ZESP surface area is 1.9 meters squared. Body mass index is 34.96 kg/m.Marland Kitchen   GENERAL: Alert, active, oriented x3  HEENT: Pupils equal reactive to light. Extraocular movements are intact. Sclera clear. Palpebral conjunctiva normal red color.Pharynx clear.  NECK: Supple with no palpable mass and no adenopathy.  LUNGS: Sound clear with no rales rhonchi or wheezes.  HEART: Regular rhythm S1 and S2 without murmur.  ABDOMEN: Soft and depressible, nontender with no palpable mass, no hepatomegaly. Wounds dry and clean.  EXTREMITIES: Well-developed well-nourished symmetrical with no dependent edema.  NEUROLOGICAL: Awake alert oriented, facial expression symmetrical, moving all extremities.    SURGICAL PATHOLOGY  CASE: [phone]  PATIENT: Barbara Johnston  Surgical Pathology Report      Specimen Submitted:  A. Appendix   Clinical History: 60 year old female with mucocele of the appendix.        DIAGNOSIS:  A. APPENDIX; APPENDECTOMY:  - HIGH-GRADE APPENDICEAL MUCINOUS NEOPLASM.  - SEE CANCER SUMMARY BELOW.   CASE SUMMARY: (APPENDIX: Resection)  Standard(s): AJCC-UICC 9   SPECIMEN  Procedure: Appendectomy   TUMOR  Tumor site: Diffusely involving appendix  Histologic type: High-grade appendiceal mucinous neoplasm  Histologic Grade: G2, moderately differentiated  Tumor Size: Greatest dimension: Approximately 10 cm  Tumor Deposits: Not identified  Tumor Extent: Acellular mucin invades visceral peritoneum (serosa)  Lymphovascular Invasion: Not identified   MARGINS  Margin Status for Invasive Carcinoma:       All margins negative for invasive carcinoma  Margin status for Non-invasive Tumor:  All margins negative for non-invasive tumor   REGIONAL LYMPH NODES  Regional Lymph Node Status: Not applicable (no regional lymph nodes  submitted or found)   DISTANT METASTASIS  Distant Site(s) Involved, if applicable: Not applicable   PATHOLOGIC STAGE CLASSIFICATION (pTNM, AJCC 8th Edition):  TNM Descriptors: Not applicable  [id number]  pN - Not assigned (no nodes submitted or found)  pM - Not applicable - pM cannot be determined from the submitted  specimen   Comment:  Acellular mucin is focally present in periappendiceal adipose tissue at  the staple margin.   GROSS DESCRIPTION:  A. Labeled: Appendix  Received: Fresh  Collection time: 12:48 PM on 04/21/2021  Placed into formalin time: 1:16 PM on 04/21/2021  Size: 10.5 cm long by 2.6 cm in diameter with an attached portion of  mesoappendix, 5 x 2.5 x 1.5 cm  External surface: The serosa is white to pink and smooth with distal  areas of lobulation.  The serosa is inked black.  Perforation: None grossly appreciated.  Fecalith: None grossly appreciated.  Description: The proximal margin has a staple line which is removed and  is under inked blue.  The lumen is diffusely dilated, up to 2 cm in  diameter, and  contains tan transparent to white opaque mucinous  contents.  The mucosa is diffusely tan-pink and flattened with distal  lobulated cystlike areas.  The wall thickness ranges from 0.1 to 0.2 cm.   Block summary:  1 - 6 - resection margin, serially sectioned, submitted entirely and  sequentially  7 - 9 - appendiceal tip, serially sectioned, submitted entirely and  sequentially  10 - 20 - appendiceal cross-sections, submitted entirely and  sequentially from appendiceal tip to resection margin   RB 04/21/2021    Final Diagnosis performed by Quay Burow, MD.   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  She was oriented that due to this neoplasm being T4, high-grade, G2 the recommendation is to proceed with right hemicolectomy for complete staging and decrease recurrence rate.  Patient was oriented about robotic assisted laparoscopic right hemicolectomy.  She was oriented about the benefit of the surgery.  She was also oriented about the risk that includes bleeding, infection, anastomosis leak, bowel obstruction, injury to adjacent organ, among others.  The patient and the husband endorses that they understood and agreed to proceed. -We will order preoperative work-up that includes CEA, CA 19-9, CEA 125 and chest CT scan.  Patient with previous  abdominal pelvic CT scan without evidence of metastatic disease in the abdomen.   PLAN:  Robotic assisted laparoscopic right hemicolectomy ([id number]) Chest CT scan with IV contrast CEA, C19-9, CA125 CBC, CMP Hold Eliquis 2 days before surgery Bowel prep the day before surgery Take antibiotics as prescribed the day before surgery Contact us if you have any concern.   Patient verbalized understanding, all questions were answered, and were agreeable  with the plan outlined above.   Herbert Pun, MD  Electronically signed by Herbert Pun, MD

## 2021-05-05 NOTE — Pre-Procedure Instructions (Signed)
Phone call to patient to go over any updates with allergies, medication, etc... and to provide pre-surgery instructions. Covid test to be 05/17/21 and will obtain type and screen at that time.

## 2021-05-10 ENCOUNTER — Other Ambulatory Visit: Payer: Self-pay

## 2021-05-10 ENCOUNTER — Ambulatory Visit
Admission: RE | Admit: 2021-05-10 | Discharge: 2021-05-10 | Disposition: A | Discharge status: Home / Self Care | Payer: BC Managed Care – PPO | Source: Ambulatory Visit | Attending: Internal Medicine | Admitting: Internal Medicine

## 2021-05-10 DIAGNOSIS — C181 Malignant neoplasm of appendix: Secondary | ICD-10-CM | POA: Diagnosis present

## 2021-05-10 DIAGNOSIS — Z1231 Encounter for screening mammogram for malignant neoplasm of breast: Secondary | ICD-10-CM | POA: Insufficient documentation

## 2021-05-11 ENCOUNTER — Ambulatory Visit
Admission: RE | Admit: 2021-05-11 | Discharge: 2021-05-11 | Disposition: A | Discharge status: Home / Self Care | Payer: BC Managed Care – PPO | Source: Ambulatory Visit | Attending: General Surgery | Admitting: General Surgery

## 2021-05-11 DIAGNOSIS — C181 Malignant neoplasm of appendix: Secondary | ICD-10-CM | POA: Diagnosis not present

## 2021-05-11 MED ORDER — IOHEXOL 300 MG/ML  SOLN
75.0000 mL | Freq: Once | INTRAMUSCULAR | Status: AC | PRN
  Administered 2021-05-11: 75 mL via INTRAVENOUS

## 2021-05-17 ENCOUNTER — Other Ambulatory Visit
Admission: RE | Admit: 2021-05-17 | Discharge: 2021-05-17 | Disposition: A | Discharge status: Home / Self Care | Payer: BC Managed Care – PPO | Source: Ambulatory Visit | Attending: General Surgery | Admitting: General Surgery

## 2021-05-17 ENCOUNTER — Other Ambulatory Visit: Payer: BC Managed Care – PPO

## 2021-05-17 ENCOUNTER — Other Ambulatory Visit: Payer: Self-pay

## 2021-05-17 DIAGNOSIS — Z01812 Encounter for preprocedural laboratory examination: Secondary | ICD-10-CM

## 2021-05-17 DIAGNOSIS — Z20822 Contact with and (suspected) exposure to covid-19: Secondary | ICD-10-CM

## 2021-05-17 LAB — TYPE AND SCREEN
ABO/RH(D): A NEG
Antibody Screen: NEGATIVE

## 2021-05-18 LAB — SARS CORONAVIRUS 2 (TAT 6-24 HRS): SARS Coronavirus 2: NEGATIVE

## 2021-05-19 ENCOUNTER — Encounter: Payer: Self-pay | Admitting: General Surgery

## 2021-05-19 ENCOUNTER — Encounter
Admission: RE | Disposition: A | Discharge status: Home / Self Care | Payer: Self-pay | Source: Home / Self Care | Attending: General Surgery

## 2021-05-19 ENCOUNTER — Inpatient Hospital Stay: Payer: BC Managed Care – PPO | Admitting: Urgent Care

## 2021-05-19 ENCOUNTER — Other Ambulatory Visit: Payer: Self-pay

## 2021-05-19 ENCOUNTER — Inpatient Hospital Stay
Admission: RE | Admit: 2021-05-19 | Discharge: 2021-05-21 | DRG: 331 | Disposition: A | Discharge status: Home / Self Care | Payer: BC Managed Care – PPO | Attending: General Surgery | Admitting: General Surgery

## 2021-05-19 DIAGNOSIS — K388 Other specified diseases of appendix: Secondary | ICD-10-CM | POA: Diagnosis present

## 2021-05-19 DIAGNOSIS — E785 Hyperlipidemia, unspecified: Secondary | ICD-10-CM | POA: Diagnosis present

## 2021-05-19 DIAGNOSIS — I4891 Unspecified atrial fibrillation: Secondary | ICD-10-CM | POA: Diagnosis present

## 2021-05-19 DIAGNOSIS — C181 Malignant neoplasm of appendix: Secondary | ICD-10-CM | POA: Diagnosis present

## 2021-05-19 DIAGNOSIS — Z01812 Encounter for preprocedural laboratory examination: Secondary | ICD-10-CM

## 2021-05-19 DIAGNOSIS — Z20822 Contact with and (suspected) exposure to covid-19: Secondary | ICD-10-CM | POA: Diagnosis present

## 2021-05-19 DIAGNOSIS — Z8249 Family history of ischemic heart disease and other diseases of the circulatory system: Secondary | ICD-10-CM | POA: Diagnosis not present

## 2021-05-19 DIAGNOSIS — I10 Essential (primary) hypertension: Secondary | ICD-10-CM | POA: Diagnosis present

## 2021-05-19 DIAGNOSIS — K219 Gastro-esophageal reflux disease without esophagitis: Secondary | ICD-10-CM | POA: Diagnosis present

## 2021-05-19 DIAGNOSIS — Z8711 Personal history of peptic ulcer disease: Secondary | ICD-10-CM

## 2021-05-19 SURGERY — COLECTOMY, RIGHT, ROBOT-ASSISTED
Anesthesia: General | Laterality: Right

## 2021-05-19 MED ORDER — MAGNESIUM OXIDE -MG SUPPLEMENT 400 (240 MG) MG PO TABS
800.0000 mg | ORAL_TABLET | Freq: Every day | ORAL | Status: DC
  Administered 2021-05-20 – 2021-05-21 (×2): 800 mg via ORAL
  Filled 2021-05-19 (×3): qty 2

## 2021-05-19 MED ORDER — ACETAMINOPHEN 500 MG PO TABS
500.0000 mg | ORAL_TABLET | Freq: Four times a day (QID) | ORAL | Status: DC | PRN
  Administered 2021-05-20: 500 mg via ORAL
  Filled 2021-05-19 (×2): qty 1

## 2021-05-19 MED ORDER — PROPOFOL 500 MG/50ML IV EMUL
INTRAVENOUS | Status: DC | PRN
  Administered 2021-05-19: 150 ug/kg/min via INTRAVENOUS

## 2021-05-19 MED ORDER — MIDAZOLAM HCL 2 MG/2ML IJ SOLN
INTRAMUSCULAR | Status: DC | PRN
  Administered 2021-05-19: 2 mg via INTRAVENOUS

## 2021-05-19 MED ORDER — BUPIVACAINE LIPOSOME 1.3 % IJ SUSP: 20.0000 mL | Freq: Once | INTRAMUSCULAR | Status: DC

## 2021-05-19 MED ORDER — BUPIVACAINE HCL (PF) 0.5 % IJ SOLN
INTRAMUSCULAR | Status: AC
  Filled 2021-05-19: qty 30

## 2021-05-19 MED ORDER — ACETAMINOPHEN 500 MG PO TABS
ORAL_TABLET | ORAL | Status: AC
  Administered 2021-05-19: 1000 mg via ORAL
  Filled 2021-05-19: qty 2

## 2021-05-19 MED ORDER — PROPOFOL 10 MG/ML IV BOLUS
INTRAVENOUS | Status: DC | PRN
  Administered 2021-05-19: 150 mg via INTRAVENOUS

## 2021-05-19 MED ORDER — HYDROMORPHONE HCL 1 MG/ML IJ SOLN
INTRAMUSCULAR | Status: AC
  Administered 2021-05-19: 0.25 mg via INTRAVENOUS
  Filled 2021-05-19: qty 1

## 2021-05-19 MED ORDER — HYDROMORPHONE HCL 1 MG/ML IJ SOLN
0.2500 mg | INTRAMUSCULAR | Status: DC | PRN
  Administered 2021-05-19 (×3): 0.25 mg via INTRAVENOUS

## 2021-05-19 MED ORDER — DEXMEDETOMIDINE (PRECEDEX) IN NS 20 MCG/5ML (4 MCG/ML) IV SYRINGE
PREFILLED_SYRINGE | INTRAVENOUS | Status: DC | PRN
  Administered 2021-05-19 (×2): 8 ug via INTRAVENOUS
  Administered 2021-05-19: 4 ug via INTRAVENOUS
  Administered 2021-05-19: 12 ug via INTRAVENOUS
  Administered 2021-05-19: 8 ug via INTRAVENOUS

## 2021-05-19 MED ORDER — TRAMADOL HCL 50 MG PO TABS
ORAL_TABLET | ORAL | Status: AC
  Filled 2021-05-19: qty 1

## 2021-05-19 MED ORDER — SUGAMMADEX SODIUM 500 MG/5ML IV SOLN
INTRAVENOUS | Status: DC | PRN
  Administered 2021-05-19: 200 mg via INTRAVENOUS

## 2021-05-19 MED ORDER — PROPOFOL 10 MG/ML IV BOLUS
INTRAVENOUS | Status: AC
  Filled 2021-05-19: qty 20

## 2021-05-19 MED ORDER — POTASSIUM CHLORIDE CRYS ER 10 MEQ PO TBCR
10.0000 meq | EXTENDED_RELEASE_TABLET | Freq: Every day | ORAL | Status: DC
  Administered 2021-05-20 – 2021-05-21 (×2): 10 meq via ORAL
  Filled 2021-05-19 (×3): qty 1

## 2021-05-19 MED ORDER — CELECOXIB 200 MG PO CAPS
200.0000 mg | ORAL_CAPSULE | Freq: Two times a day (BID) | ORAL | Status: DC
  Filled 2021-05-19 (×2): qty 1

## 2021-05-19 MED ORDER — INDOCYANINE GREEN 25 MG IV SOLR
INTRAVENOUS | Status: DC | PRN
  Administered 2021-05-19: 7.5 mg via INTRAVENOUS
  Administered 2021-05-19: 5 mg via INTRAVENOUS

## 2021-05-19 MED ORDER — SODIUM CHLORIDE (PF) 0.9 % IJ SOLN
INTRAMUSCULAR | Status: AC
  Filled 2021-05-19: qty 50

## 2021-05-19 MED ORDER — APREPITANT 40 MG PO CAPS
ORAL_CAPSULE | ORAL | Status: AC
  Administered 2021-05-19: 40 mg via ORAL
  Filled 2021-05-19: qty 1

## 2021-05-19 MED ORDER — CELECOXIB 200 MG PO CAPS
ORAL_CAPSULE | ORAL | Status: AC
  Administered 2021-05-19: 200 mg via ORAL
  Filled 2021-05-19: qty 1

## 2021-05-19 MED ORDER — ALVIMOPAN 12 MG PO CAPS
12.0000 mg | ORAL_CAPSULE | Freq: Two times a day (BID) | ORAL | Status: DC
  Administered 2021-05-20 – 2021-05-21 (×3): 12 mg via ORAL
  Filled 2021-05-19 (×4): qty 1

## 2021-05-19 MED ORDER — ALVIMOPAN 12 MG PO CAPS
ORAL_CAPSULE | ORAL | Status: AC
  Administered 2021-05-19: 12 mg via ORAL
  Filled 2021-05-19: qty 1

## 2021-05-19 MED ORDER — BUPIVACAINE LIPOSOME 1.3 % IJ SUSP
INTRAMUSCULAR | Status: AC
  Filled 2021-05-19: qty 20

## 2021-05-19 MED ORDER — LACTATED RINGERS IV SOLN: INTRAVENOUS | Status: DC

## 2021-05-19 MED ORDER — PANTOPRAZOLE SODIUM 40 MG IV SOLR
40.0000 mg | Freq: Every day | INTRAVENOUS | Status: DC
  Administered 2021-05-19: 40 mg via INTRAVENOUS
  Filled 2021-05-19: qty 40

## 2021-05-19 MED ORDER — BUPIVACAINE HCL (PF) 0.5 % IJ SOLN
INTRAMUSCULAR | Status: DC | PRN
  Administered 2021-05-19: 30 mL

## 2021-05-19 MED ORDER — L-LYSINE HCL 500 MG PO TABS: 500.0000 mg | ORAL_TABLET | Freq: Every day | ORAL | Status: DC

## 2021-05-19 MED ORDER — SODIUM CHLORIDE 0.9 % IV SOLN
2.0000 g | Freq: Two times a day (BID) | INTRAVENOUS | Status: AC
  Administered 2021-05-19: 2 g via INTRAVENOUS
  Filled 2021-05-19: qty 2

## 2021-05-19 MED ORDER — PROPOFOL 500 MG/50ML IV EMUL
INTRAVENOUS | Status: AC
  Filled 2021-05-19: qty 50

## 2021-05-19 MED ORDER — PROMETHAZINE HCL 25 MG/ML IJ SOLN: 6.2500 mg | INTRAMUSCULAR | Status: DC | PRN

## 2021-05-19 MED ORDER — CELECOXIB 200 MG PO CAPS: 200.0000 mg | ORAL_CAPSULE | ORAL | Status: AC

## 2021-05-19 MED ORDER — SCOPOLAMINE 1 MG/3DAYS TD PT72
MEDICATED_PATCH | TRANSDERMAL | Status: AC
  Administered 2021-05-19: 1.5 mg via TRANSDERMAL
  Filled 2021-05-19: qty 1

## 2021-05-19 MED ORDER — ENOXAPARIN SODIUM 40 MG/0.4ML IJ SOSY
40.0000 mg | PREFILLED_SYRINGE | INTRAMUSCULAR | Status: DC
  Administered 2021-05-20 – 2021-05-21 (×2): 40 mg via SUBCUTANEOUS
  Filled 2021-05-19 (×2): qty 0.4

## 2021-05-19 MED ORDER — DROPERIDOL 2.5 MG/ML IJ SOLN
0.6250 mg | Freq: Once | INTRAMUSCULAR | Status: DC | PRN
  Filled 2021-05-19: qty 2

## 2021-05-19 MED ORDER — KETAMINE HCL 50 MG/5ML IJ SOSY
PREFILLED_SYRINGE | INTRAMUSCULAR | Status: AC
  Filled 2021-05-19: qty 5

## 2021-05-19 MED ORDER — FENTANYL CITRATE (PF) 100 MCG/2ML IJ SOLN
25.0000 ug | INTRAMUSCULAR | Status: DC | PRN
  Administered 2021-05-19 (×3): 25 ug via INTRAVENOUS

## 2021-05-19 MED ORDER — DEXMEDETOMIDINE (PRECEDEX) IN NS 20 MCG/5ML (4 MCG/ML) IV SYRINGE: PREFILLED_SYRINGE | INTRAVENOUS | Status: DC | PRN

## 2021-05-19 MED ORDER — METOPROLOL SUCCINATE ER 50 MG PO TB24: 50.0000 mg | ORAL_TABLET | Freq: Every day | ORAL | Status: DC | PRN

## 2021-05-19 MED ORDER — MORPHINE SULFATE (PF) 4 MG/ML IV SOLN: 4.0000 mg | INTRAVENOUS | Status: DC | PRN

## 2021-05-19 MED ORDER — FENTANYL CITRATE (PF) 100 MCG/2ML IJ SOLN
INTRAMUSCULAR | Status: AC
  Filled 2021-05-19: qty 2

## 2021-05-19 MED ORDER — ONDANSETRON HCL 4 MG/2ML IJ SOLN
INTRAMUSCULAR | Status: DC | PRN
  Administered 2021-05-19: 4 mg via INTRAVENOUS

## 2021-05-19 MED ORDER — ONDANSETRON HCL 4 MG/2ML IJ SOLN
4.0000 mg | Freq: Four times a day (QID) | INTRAMUSCULAR | Status: DC | PRN
  Administered 2021-05-19: 4 mg via INTRAVENOUS
  Filled 2021-05-19: qty 2

## 2021-05-19 MED ORDER — SCOPOLAMINE 1 MG/3DAYS TD PT72: 1.0000 | MEDICATED_PATCH | TRANSDERMAL | Status: DC

## 2021-05-19 MED ORDER — FENTANYL CITRATE (PF) 100 MCG/2ML IJ SOLN
INTRAMUSCULAR | Status: DC | PRN
  Administered 2021-05-19: 50 ug via INTRAVENOUS
  Administered 2021-05-19: 100 ug via INTRAVENOUS
  Administered 2021-05-19: 50 ug via INTRAVENOUS

## 2021-05-19 MED ORDER — ONDANSETRON 4 MG PO TBDP: 4.0000 mg | ORAL_TABLET | Freq: Four times a day (QID) | ORAL | Status: DC | PRN

## 2021-05-19 MED ORDER — MIDAZOLAM HCL 2 MG/2ML IJ SOLN
INTRAMUSCULAR | Status: AC
  Filled 2021-05-19: qty 2

## 2021-05-19 MED ORDER — ACETAMINOPHEN 10 MG/ML IV SOLN: 1000.0000 mg | Freq: Once | INTRAVENOUS | Status: DC | PRN

## 2021-05-19 MED ORDER — FUROSEMIDE 20 MG PO TABS
20.0000 mg | ORAL_TABLET | Freq: Every day | ORAL | Status: DC
  Administered 2021-05-19 – 2021-05-21 (×3): 20 mg via ORAL
  Filled 2021-05-19 (×3): qty 1

## 2021-05-19 MED ORDER — RISAQUAD PO CAPS
1.0000 | ORAL_CAPSULE | Freq: Every day | ORAL | Status: DC
  Administered 2021-05-20 – 2021-05-21 (×2): 1 via ORAL
  Filled 2021-05-19 (×3): qty 1

## 2021-05-19 MED ORDER — APREPITANT 40 MG PO CAPS: 40.0000 mg | ORAL_CAPSULE | Freq: Once | ORAL | Status: AC

## 2021-05-19 MED ORDER — GABAPENTIN 300 MG PO CAPS
ORAL_CAPSULE | ORAL | Status: AC
  Administered 2021-05-19: 300 mg via ORAL
  Filled 2021-05-19: qty 1

## 2021-05-19 MED ORDER — TRAMADOL HCL 50 MG PO TABS
50.0000 mg | ORAL_TABLET | Freq: Four times a day (QID) | ORAL | Status: DC | PRN
  Administered 2021-05-19 – 2021-05-20 (×3): 50 mg via ORAL
  Filled 2021-05-19 (×2): qty 1

## 2021-05-19 MED ORDER — SODIUM CHLORIDE 0.9 % IV SOLN: INTRAVENOUS | Status: DC

## 2021-05-19 MED ORDER — SODIUM CHLORIDE 0.9 % IV SOLN
2.0000 g | INTRAVENOUS | Status: AC
  Administered 2021-05-19: 2 g via INTRAVENOUS

## 2021-05-19 MED ORDER — ORAL CARE MOUTH RINSE: 15.0000 mL | Freq: Once | OROMUCOSAL | Status: AC

## 2021-05-19 MED ORDER — GABAPENTIN 300 MG PO CAPS
300.0000 mg | ORAL_CAPSULE | Freq: Two times a day (BID) | ORAL | Status: DC
  Administered 2021-05-19 – 2021-05-21 (×4): 300 mg via ORAL
  Filled 2021-05-19 (×4): qty 1

## 2021-05-19 MED ORDER — KETAMINE HCL 10 MG/ML IJ SOLN
INTRAMUSCULAR | Status: DC | PRN
  Administered 2021-05-19: 30 mg via INTRAVENOUS
  Administered 2021-05-19 (×3): 20 mg via INTRAVENOUS

## 2021-05-19 MED ORDER — CHLORHEXIDINE GLUCONATE 0.12 % MT SOLN
15.0000 mL | Freq: Once | OROMUCOSAL | Status: AC
  Administered 2021-05-19: 15 mL via OROMUCOSAL

## 2021-05-19 MED ORDER — ACETAMINOPHEN 500 MG PO TABS: 1000.0000 mg | ORAL_TABLET | ORAL | Status: AC

## 2021-05-19 MED ORDER — SODIUM CHLORIDE 0.9 % IV SOLN
INTRAVENOUS | Status: AC
  Filled 2021-05-19: qty 2

## 2021-05-19 MED ORDER — PHENYLEPHRINE HCL-NACL 20-0.9 MG/250ML-% IV SOLN
INTRAVENOUS | Status: DC | PRN
  Administered 2021-05-19: 20 ug/min via INTRAVENOUS

## 2021-05-19 MED ORDER — BUPIVACAINE LIPOSOME 1.3 % IJ SUSP
INTRAMUSCULAR | Status: DC | PRN
  Administered 2021-05-19: 20 mL

## 2021-05-19 MED ORDER — DEXAMETHASONE SODIUM PHOSPHATE 10 MG/ML IJ SOLN
INTRAMUSCULAR | Status: DC | PRN
  Administered 2021-05-19: 10 mg via INTRAVENOUS

## 2021-05-19 MED ORDER — ALVIMOPAN 12 MG PO CAPS: 12.0000 mg | ORAL_CAPSULE | ORAL | Status: AC

## 2021-05-19 MED ORDER — GABAPENTIN 300 MG PO CAPS: 300.0000 mg | ORAL_CAPSULE | ORAL | Status: AC

## 2021-05-19 MED ORDER — FENTANYL CITRATE (PF) 100 MCG/2ML IJ SOLN
INTRAMUSCULAR | Status: AC
  Administered 2021-05-19: 25 ug via INTRAVENOUS
  Filled 2021-05-19: qty 2

## 2021-05-19 MED ORDER — ROCURONIUM BROMIDE 100 MG/10ML IV SOLN
INTRAVENOUS | Status: DC | PRN
  Administered 2021-05-19: 20 mg via INTRAVENOUS
  Administered 2021-05-19: 30 mg via INTRAVENOUS
  Administered 2021-05-19 (×2): 20 mg via INTRAVENOUS
  Administered 2021-05-19: 50 mg via INTRAVENOUS

## 2021-05-19 SURGICAL SUPPLY — 94 items
BAG LAPAROSCOPIC 12 15 PORT 16 (BASKET) ×2 IMPLANT
BAG RETRIEVAL 12/15 (BASKET) ×3
BLADE CLIPPER SURG (BLADE) IMPLANT
BLADE SURG SZ10 CARB STEEL (BLADE) ×3 IMPLANT
BLADE SURG SZ11 CARB STEEL (BLADE) ×3 IMPLANT
CANNULA REDUC XI 12-8 STAPL (CANNULA) ×1
CANNULA REDUCER 12-8 DVNC XI (CANNULA) ×2 IMPLANT
CATH COUDE FOLEY 5CC 14FR (CATHETERS) ×3 IMPLANT
CHLORAPREP W/TINT 26 (MISCELLANEOUS) IMPLANT
CLIP LIGATING HEMO O LOK GREEN (MISCELLANEOUS) ×3 IMPLANT
CLIP VESOLOCK MED LG 6/CT (CLIP) ×3 IMPLANT
COVER TIP SHEARS 8 DVNC (MISCELLANEOUS) ×2 IMPLANT
COVER TIP SHEARS 8MM DA VINCI (MISCELLANEOUS) ×1
DEFOGGER SCOPE WARMER CLEARIFY (MISCELLANEOUS) ×3 IMPLANT
DERMABOND ADVANCED (GAUZE/BANDAGES/DRESSINGS)
DERMABOND ADVANCED .7 DNX12 (GAUZE/BANDAGES/DRESSINGS) IMPLANT
DRAPE ARM DVNC X/XI (DISPOSABLE) ×8 IMPLANT
DRAPE COLUMN DVNC XI (DISPOSABLE) ×2 IMPLANT
DRAPE DA VINCI XI ARM (DISPOSABLE) ×4
DRAPE DA VINCI XI COLUMN (DISPOSABLE) ×1
DRSG OPSITE POSTOP 4X10 (GAUZE/BANDAGES/DRESSINGS) IMPLANT
DRSG OPSITE POSTOP 4X8 (GAUZE/BANDAGES/DRESSINGS) IMPLANT
ELECT BLADE 6.5 EXT (BLADE) IMPLANT
ELECT CAUTERY BLADE 6.4 (BLADE) IMPLANT
ELECT REM PT RETURN 9FT ADLT (ELECTROSURGICAL) ×3
ELECTRODE REM PT RTRN 9FT ADLT (ELECTROSURGICAL) ×2 IMPLANT
GAUZE 4X4 16PLY ~~LOC~~+RFID DBL (SPONGE) ×3 IMPLANT
GLOVE SURG ENC MOIS LTX SZ6.5 (GLOVE) ×9 IMPLANT
GLOVE SURG UNDER POLY LF SZ6.5 (GLOVE) ×9 IMPLANT
GOWN STRL REUS W/ TWL LRG LVL3 (GOWN DISPOSABLE) ×12 IMPLANT
GOWN STRL REUS W/TWL LRG LVL3 (GOWN DISPOSABLE) ×6
GRASPER SUT TROCAR 14GX15 (MISCELLANEOUS) ×3 IMPLANT
HANDLE YANKAUER SUCT BULB TIP (MISCELLANEOUS) ×3 IMPLANT
IRRIGATION STRYKERFLOW (MISCELLANEOUS) IMPLANT
IRRIGATOR STRYKERFLOW (MISCELLANEOUS)
IRRIGATOR SUCT 8 DISP DVNC XI (IRRIGATION / IRRIGATOR) IMPLANT
IRRIGATOR SUCTION 8MM XI DISP (IRRIGATION / IRRIGATOR)
IV NS 1000ML (IV SOLUTION)
IV NS 1000ML BAXH (IV SOLUTION) IMPLANT
KIT IMAGING PINPOINTPAQ (MISCELLANEOUS) ×3 IMPLANT
KIT PINK PAD W/HEAD ARE REST (MISCELLANEOUS) ×3
KIT PINK PAD W/HEAD ARM REST (MISCELLANEOUS) ×2 IMPLANT
LABEL OR SOLS (LABEL) IMPLANT
MANIFOLD NEPTUNE II (INSTRUMENTS) ×3 IMPLANT
NEEDLE HYPO 22GX1.5 SAFETY (NEEDLE) ×3 IMPLANT
NEEDLE INSUFFLATION 14GA 120MM (NEEDLE) ×3 IMPLANT
OBTURATOR OPTICAL STANDARD 8MM (TROCAR) ×1
OBTURATOR OPTICAL STND 8 DVNC (TROCAR) ×2
OBTURATOR OPTICALSTD 8 DVNC (TROCAR) ×2 IMPLANT
PACK COLON CLEAN CLOSURE (MISCELLANEOUS) ×3 IMPLANT
PACK LAP CHOLECYSTECTOMY (MISCELLANEOUS) ×3 IMPLANT
PENCIL ELECTRO HAND CTR (MISCELLANEOUS) ×3 IMPLANT
PORT ACCESS TROCAR AIRSEAL 5 (TROCAR) ×3 IMPLANT
POUCH SPECIMEN RETRIEVAL 10MM (ENDOMECHANICALS) IMPLANT
RELOAD STAPLER 2.5X60 WHT DVNC (STAPLE) ×2 IMPLANT
RELOAD STAPLER 3.5X45 BLU DVNC (STAPLE) IMPLANT
RELOAD STAPLER 3.5X60 BLU DVNC (STAPLE) ×4 IMPLANT
RETRACTOR WOUND ALXS 18CM SML (MISCELLANEOUS) IMPLANT
RTRCTR WOUND ALEXIS O 18CM SML (MISCELLANEOUS)
SEAL CANN UNIV 5-8 DVNC XI (MISCELLANEOUS) ×6 IMPLANT
SEAL XI 5MM-8MM UNIVERSAL (MISCELLANEOUS) ×3
SEALER VESSEL DA VINCI XI (MISCELLANEOUS) ×1
SEALER VESSEL EXT DVNC XI (MISCELLANEOUS) ×2 IMPLANT
SET TRI-LUMEN FLTR TB AIRSEAL (TUBING) ×3 IMPLANT
SOLUTION ELECTROLUBE (MISCELLANEOUS) ×3 IMPLANT
SPONGE T-LAP 18X18 ~~LOC~~+RFID (SPONGE) ×6 IMPLANT
SPONGE T-LAP 4X18 ~~LOC~~+RFID (SPONGE) ×3 IMPLANT
STAPLER 45 DA VINCI SURE FORM (STAPLE)
STAPLER 45 SUREFORM DVNC (STAPLE) IMPLANT
STAPLER 60 DA VINCI SURE FORM (STAPLE) ×1
STAPLER 60 SUREFORM DVNC (STAPLE) ×2 IMPLANT
STAPLER CANNULA SEAL DVNC XI (STAPLE) ×2 IMPLANT
STAPLER CANNULA SEAL XI (STAPLE) ×1
STAPLER RELOAD 2.5X60 WHITE (STAPLE) ×1
STAPLER RELOAD 2.5X60 WHT DVNC (STAPLE) ×2
STAPLER RELOAD 3.5X45 BLU DVNC (STAPLE)
STAPLER RELOAD 3.5X45 BLUE (STAPLE)
STAPLER RELOAD 3.5X60 BLU DVNC (STAPLE) ×4
STAPLER RELOAD 3.5X60 BLUE (STAPLE) ×2
SUT MNCRL 4-0 (SUTURE) ×1
SUT MNCRL 4-0 27XMFL (SUTURE) ×2
SUT MNCRL AB 4-0 PS2 18 (SUTURE) ×3 IMPLANT
SUT PDS AB 0 CT1 27 (SUTURE) IMPLANT
SUT SILK 3 0 SH 30 (SUTURE) IMPLANT
SUT STRATAFIX 0 PDS+ CT-2 23 (SUTURE) ×3
SUT VIC AB 3-0 SH 27 (SUTURE) ×1
SUT VIC AB 3-0 SH 27X BRD (SUTURE) ×2 IMPLANT
SUT VICRYL 0 AB UR-6 (SUTURE) ×6 IMPLANT
SUT VLOC 90 6 CV-15 VIOLET (SUTURE) ×6 IMPLANT
SUTURE MNCRL 4-0 27XMF (SUTURE) ×2 IMPLANT
SUTURE STRATFX 0 PDS+ CT-2 23 (SUTURE) ×2 IMPLANT
SYR 30ML LL (SYRINGE) ×6 IMPLANT
TRAY FOLEY MTR SLVR 16FR STAT (SET/KITS/TRAYS/PACK) ×3 IMPLANT
WATER STERILE IRR 500ML POUR (IV SOLUTION) IMPLANT

## 2021-05-19 NOTE — Op Note (Signed)
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   Anesthesia: GETA   Surgeon: Herbert Pun, MD   Wound Classification: clean contaminated   Specimen: Right colon   Complications: None   Estimated Blood Loss: 25 mL   Indications: Patient is a 60 y.o. 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  Right hemicolectomy indicated for oncologic grading and treatment.   FIndings: 1.  No gross metastasis noted 2.  Adequate hemostasis 3.  ICG green evaluation shows adequate perfusion of the ileocolonic anastomosis.   Description of procedure: The patient was placed on the operating table in the supine position, left arm tucked. General anesthesia was induced. A time-out was completed verifying correct patient, procedure, site, positioning, and implant(s) and/or special equipment prior to beginning this procedure. The abdomen was prepped and draped in the usual sterile fashion.    Veress needle inserted in the Palmer's point.  Gas insufflation was initiated until the abdominal pressure was measured at 15 mmHg.  Afterwards, 3 8 mm trochars and one 12 mm trochars were placed along the left abdominal wall. 5 mm assistant port was then placed between 2 of the robotic ports.  No injuries from trocar placements were noted. The table was placed in the Trendelenburg position with the right side elevated.  Xi robotic platform was then brought to the operative field and docked.  Tip up grasper and scissors was placed in right arm ports.  Fenestrated bipolar in left arm port.   Examination of the abdominal cavity noted no signs of gross metastasis.  Dissection was started by removing the lateral attachments of the right colon along the white line of Toldt, ensuring the right ureter was not involved.  This was carried around the hepatic flexure to the mid portion of the transverse colon.   Afterwards, the right colon was grasped and elevated to visualize mesentary. Point was chosen on the transverse colon for staple transection, measuring at least 5 cm from the mass.  53mm blue load stapler was then used to transect the colon at this point.  Vessel sealer was then used to transect the right colon mesentery towards a previously determined point on the terminal ileum, care taken to ensure as much mesentery was taken for lymph node evaluation, and also visualizing the duodenum and placing it away from area of transection during this portion.  Once the terminal ileum was reached, 56mm blue load stapler was used to transect the terminal ileum.  ICG was then infused and the staple lines were confirmed to have adequate blood flow.   The transected specimen was placed atop the liver.  The small bowel was then brought towards the transverse colon and a isoperistaltic anastomosis was created.  Enterotomies were made in the small bowel and the transverse colon, small bowel enterotomy created 2 cm from the staple line and transverse colon enterotomy made 8 cm from the staple line.  60 mm white load stapler placed through these enterotomies and new anastomosis created.  The enterotomy was then closed by placing 2 anchor sutures at the 2 apexes using 2-0 Vicryl, then running 3-0 V-Lock in a 2 layer fashion.    No bleeding or additional pathology was noted. Specimen placed in a bag.  Robot was then undocked, and the remaining port sites were removed, the abdomen was allowed to collapse.  A small Pfannenstiel incision was done for extraction of the specimen.  Specimen removed.  The  12 mm port site fascia was closed with 0 Vicryl.  Define a centimeter incision anterior fascia was closed with 0 STRATAFIX.  Deep dermal then closed with 3-0 vicryl interrupted fashion.     Exparel infused at all port sites. All skin incisions then closed with subcuticular sutures Monocryl 4-0.  Wounds then dressed with dermabond.    The patient tolerated the procedure well, awakened from anesthesia and was taken to the postanesthesia care unit in satisfactory condition.  Foley still in place.  Sponge count and instrument count correct at the end of the procedure.

## 2021-05-19 NOTE — Anesthesia Postprocedure Evaluation (Signed)
Anesthesia Post Note  Patient: Barbara Johnston  Procedure(s) Performed: XI ROBOT ASSISTED RIGHT COLECTOMY (Right) INDOCYANINE GREEN FLUORESCENCE IMAGING (ICG)  Patient location during evaluation: PACU Anesthesia Type: General Level of consciousness: awake and alert Pain management: pain level controlled Vital Signs Assessment: post-procedure vital signs reviewed and stable Respiratory status: spontaneous breathing, nonlabored ventilation, respiratory function stable and patient connected to nasal cannula oxygen Cardiovascular status: blood pressure returned to baseline and stable Postop Assessment: no apparent nausea or vomiting Anesthetic complications: no   No notable events documented.   Last Vitals:  Vitals:   05/19/21 1330 05/19/21 1400  BP: (!) 112/53 119/67  Pulse: 64 (!) 57  Resp: 18 17  Temp: (!) 36.2 C (!) 36.3 C  SpO2: 100% 100%    Last Pain:  Vitals:   05/19/21 1400  TempSrc:   PainSc: [place]

## 2021-05-19 NOTE — Anesthesia Procedure Notes (Signed)
Procedure Name: Intubation Date/Time: 05/19/2021 7:37 AM Performed by: Biagio Borg, CRNA Pre-anesthesia Checklist: Patient identified, Emergency Drugs available, Suction available and Patient being monitored Patient Re-evaluated:Patient Re-evaluated prior to induction Oxygen Delivery Method: Circle system utilized Preoxygenation: Pre-oxygenation with 100% oxygen Induction Type: IV induction Ventilation: Mask ventilation without difficulty Laryngoscope Size: McGraph and 3 Grade View: Grade I Tube type: Oral Tube size: 7.0 mm Number of attempts: 1 Airway Equipment and Method: Stylet Placement Confirmation: ETT inserted through vocal cords under direct vision, positive ETCO2 and breath sounds checked- equal and bilateral Secured at: 21 cm Tube secured with: Tape Dental Injury: Teeth and Oropharynx as per pre-operative assessment

## 2021-05-19 NOTE — Transfer of Care (Signed)
Immediate Anesthesia Transfer of Care Note  Patient: Barbara Johnston  Procedure(s) Performed: XI ROBOT ASSISTED RIGHT COLECTOMY (Right) INDOCYANINE GREEN FLUORESCENCE IMAGING (ICG)  Patient Location: PACU  Anesthesia Type:General  Level of Consciousness: drowsy  Airway & Oxygen Therapy: Patient Spontanous Breathing, Patient connected to face mask oxygen and Patient connected to face mask  Post-op Assessment: Report given to RN and Post -op Vital signs reviewed and stable  Post vital signs: Reviewed and stable  Last Vitals:  Vitals Value Taken Time  BP 123/64 05/19/21 1102  Temp    Pulse 57 05/19/21 1105  Resp 17 05/19/21 1105  SpO2 100 % 05/19/21 1105  Vitals shown include unvalidated device data.  Last Pain:  Vitals:   05/19/21 0640  TempSrc: Temporal  PainSc: 0-No pain         Complications: No notable events documented.

## 2021-05-19 NOTE — Interval H&P Note (Signed)
History and Physical Interval Note:  05/19/2021 7:04 AM  Barbara Johnston  has presented today for surgery, with the diagnosis of C18.1 Malignant neoplasm of appendix.  The various methods of treatment have been discussed with the patient and family. After consideration of risks, benefits and other options for treatment, the patient has consented to  Procedure(s): XI ROBOT ASSISTED RIGHT COLECTOMY (Right) as a surgical intervention.  The patient's history has been reviewed, patient examined, no change in status, stable for surgery.  I have reviewed the patient's chart and labs.  Questions were answered to the patient's satisfaction.     Herbert Pun

## 2021-05-19 NOTE — Anesthesia Preprocedure Evaluation (Addendum)
Anesthesia Evaluation  Patient identified by MRN, date of birth, ID band Patient awake    Reviewed: Allergy & Precautions, NPO status , Patient's Chart, lab work & pertinent test results  History of Anesthesia Complications (+) PONV and history of anesthetic complications  Airway Mallampati: III  TM Distance: >3 FB Neck ROM: Full    Dental no notable dental hx.    Pulmonary neg sleep apnea, neg COPD,    breath sounds clear to auscultation- rhonchi (-) wheezing      Cardiovascular Exercise Tolerance: Good hypertension, Pt. on medications (-) CAD, (-) Past MI, (-) Cardiac Stents and (-) CABG + dysrhythmias (s/p ablation but has paroxysmal afib treated with metoprolol) Atrial Fibrillation  Rhythm:Regular Rate:Normal - Systolic murmurs and - Diastolic murmurs    Neuro/Psych neg Seizures negative neurological ROS  negative psych ROS   GI/Hepatic Neg liver ROS, PUD (h/o), GERD  Controlled,  Endo/Other  negative endocrine ROSneg diabetes  Renal/GU negative Renal ROS     Musculoskeletal negative musculoskeletal ROS (+)   Abdominal (+) + obese,   Peds  Hematology negative hematology ROS (+)   Anesthesia Other Findings Past Medical History: No date: Aortic atherosclerosis ([place]) 08/04/2008: Ascending aorta dilation ([place])     Comment:  a.) Cardiac CT 08/04/2008: ascending aorta above the               sinotubular junction measured 3.7 cm; descending aorta               measured 2.0 cm. No date: Atrial fibrillation ([facility])     Comment:  a.) CHA2DS2-VASc = 3 (sex, HTN, aortic plaque). b.)               rate/ryhthm maintained on metoprolol; chronically               anticoagulated with reduced dose apixaban. c.) Ablation               05/08/2008. d.) DCCV 01/21/2020. e.) Repeat cardiac               ablation 02/26/2020. No date: Current use of long term anticoagulation     Comment:  a.) Apixaban No date: Gastritis No date:  Gastrointestinal ulcer No date: GERD (gastroesophageal reflux disease) No date: Hyperlipidemia No date: Hypertension No date: Motion sickness No date: Obesity No date: PONV (postoperative nausea and vomiting)   Reproductive/Obstetrics                            Anesthesia Physical  Anesthesia Plan  ASA: 3  Anesthesia Plan: General   Post-op Pain Management:    Induction: Intravenous  PONV Risk Score and Plan: 3 and Ondansetron, Aprepitant, Dexamethasone, TIVA and Midazolam  Airway Management Planned: Oral ETT  Additional Equipment:   Intra-op Plan:   Post-operative Plan: Extubation in OR  Informed Consent: I have reviewed the patients History and Physical, chart, labs and discussed the procedure including the risks, benefits and alternatives for the proposed anesthesia with the patient or authorized representative who has indicated his/her understanding and acceptance.     Dental advisory given  Plan Discussed with: CRNA and Anesthesiologist  Anesthesia Plan Comments:         Anesthesia Quick Evaluation

## 2021-05-19 NOTE — Plan of Care (Signed)
Patient from PACU. Oriented to room and call bell.  Patient alert and orineted. Family at bedside.

## 2021-05-20 LAB — BASIC METABOLIC PANEL
Anion gap: 4 — ABNORMAL LOW (ref 5–15)
BUN: 11 mg/dL (ref 8–23)
CO2: 26 mmol/L (ref 22–32)
Calcium: 8.4 mg/dL — ABNORMAL LOW (ref 8.9–10.3)
Chloride: 108 mmol/L (ref 98–111)
Creatinine, Ser: 0.61 mg/dL (ref 0.44–1.00)
GFR, Estimated: >60 mL/min (ref >60)
Glucose, Bld: 110 mg/dL — ABNORMAL HIGH (ref 70–99)
Potassium: 3.8 mmol/L (ref 3.5–5.1)
Sodium: 138 mmol/L (ref 135–145)

## 2021-05-20 LAB — CBC
HCT: 39.9 % (ref 36.0–46.0)
Hemoglobin: 13.4 g/dL (ref 12.0–15.0)
MCH: 30.2 pg (ref 26.0–34.0)
MCHC: 33.6 g/dL (ref 30.0–36.0)
MCV: 90.1 fL (ref 80.0–100.0)
Platelets: 181 10*3/uL (ref 150–400)
RBC: 4.43 MIL/uL (ref 3.87–5.11)
RDW: 12.3 % (ref 11.5–15.5)
WBC: 12 10*3/uL — ABNORMAL HIGH (ref 4.0–10.5)
nRBC: 0 % (ref 0.0–0.2)

## 2021-05-20 LAB — MAGNESIUM: Magnesium: 2.3 mg/dL (ref 1.7–2.4)

## 2021-05-20 LAB — PHOSPHORUS: Phosphorus: 4.3 mg/dL (ref 2.5–4.6)

## 2021-05-20 MED ORDER — PANTOPRAZOLE SODIUM 40 MG PO TBEC
40.0000 mg | DELAYED_RELEASE_TABLET | Freq: Every day | ORAL | Status: DC
  Administered 2021-05-20: 40 mg via ORAL
  Filled 2021-05-20: qty 1

## 2021-05-20 MED ORDER — KETOROLAC TROMETHAMINE 30 MG/ML IJ SOLN
30.0000 mg | Freq: Four times a day (QID) | INTRAMUSCULAR | Status: DC
  Administered 2021-05-20 – 2021-05-21 (×6): 30 mg via INTRAVENOUS
  Filled 2021-05-20 (×6): qty 1

## 2021-05-20 MED ORDER — HYDROCODONE-ACETAMINOPHEN 7.5-325 MG PO TABS
1.0000 | ORAL_TABLET | Freq: Four times a day (QID) | ORAL | Status: AC | PRN
  Administered 2021-05-20 (×2): 1 via ORAL
  Filled 2021-05-20 (×2): qty 1

## 2021-05-20 NOTE — Progress Notes (Signed)
Patient ID: Barbara Johnston, female   DOB: 08-02-1960, 60 y.o.   MRN: [id number]     [facility] Day(s): 1.   Interval History: Patient seen and examined, no acute events or new complaints overnight. Patient reports feeling well but with intermittent pain.  Denies any nausea or vomiting.  Vital signs in last 24 hours: [min-max] current  Temp:  [97.2 F (36.2 C)-98.4 F (36.9 C)] 98.4 F (36.9 C) (12/01 0430) Pulse Rate:  [52-70] 58 (12/01 0430) Resp:  [12-18] 16 (12/01 0430) BP: (103-140)/(53-91) 116/59 (12/01 0430) SpO2:  [94 %-100 %] 96 % (12/01 0430)     Height: 5\' 1"  (154.9 cm) Weight: 81.2 kg BMI (Calculated): 33.84   Physical Exam:  Constitutional: alert, cooperative and no distress  Respiratory: breathing non-labored at rest  Cardiovascular: regular rate and sinus rhythm  Gastrointestinal: soft, non-tender, and non-distended.  Wounds are dry and clean  Labs:  CBC Latest Ref Rng & Units 05/20/2021 01/21/2020 10/14/2017  WBC 4.0 - 10.5 K/uL 12.0(H) 7.2 5.2  Hemoglobin 12.0 - 15.0 g/dL 13.4 15.8(H) 14.3  Hematocrit 36.0 - 46.0 % 39.9 47.1(H) 41.9  Platelets 150 - 400 K/uL 181 220 159   CMP Latest Ref Rng & Units 05/20/2021 04/02/2021 01/21/2020  Glucose 70 - 99 mg/dL 110(H) - 158(H)  BUN 8 - 23 mg/dL 11 - 26(H)  Creatinine 0.44 - 1.00 mg/dL 0.61 0.60 0.96  Sodium 135 - 145 mmol/L 138 - 138  Potassium 3.5 - 5.1 mmol/L 3.8 - 4.7  Chloride 98 - 111 mmol/L 108 - 100  CO2 22 - 32 mmol/L 26 - 28  Calcium 8.9 - 10.3 mg/dL 8.4(L) - 9.3  Total Protein 6.5 - 8.1 g/dL - - -  Total Bilirubin 0.3 - 1.2 mg/dL - - -  Alkaline Phos 38 - 126 U/L - - -  AST 15 - 41 U/L - - -  ALT 14 - 54 U/L - - -    Imaging studies: No new pertinent imaging studies   Assessment/Plan:  60 y.o. female with high-grade mucinous neoplasm of the appendix 1 Day Post-Op s/p right hemicolectomy.  The patient is recovering adequately.  Expected intermittent pain.  We will adjust pain  medication adding Toradol.  I will advance her diet to full liquids.  I encouraged the patient to ambulate.  We will discontinue Foley catheter today.  We will continue to follow closely.  Arnold Long, MD

## 2021-05-20 NOTE — Progress Notes (Signed)
PHARMACIST - PHYSICIAN COMMUNICATION  CONCERNING: IV to Oral Route Change Policy  RECOMMENDATION: This patient is receiving pantoprazole by the intravenous route.  Based on criteria approved by the Pharmacy and Therapeutics Committee, the intravenous medication(s) is/are being converted to the equivalent oral dose form(s).   DESCRIPTION: These criteria include: The patient is eating (either orally or via tube) and/or has been taking other orally administered medications for a least 24 hours The patient has no evidence of active gastrointestinal bleeding or impaired GI absorption (gastrectomy, short bowel, patient on TNA or NPO).  If you have questions about this conversion, please contact the [place], [facility] 05/20/2021 10:05 AM

## 2021-05-20 NOTE — Plan of Care (Signed)
  Problem: Clinical Measurements: Goal: Ability to maintain clinical measurements within normal limits will improve Outcome: Progressing   Problem: Clinical Measurements: Goal: Will remain free from infection Outcome: Progressing   Problem: Education: Goal: Knowledge of General Education information will improve Description: Including pain rating scale, medication(s)/side effects and non-pharmacologic comfort measures Outcome: Progressing   Problem: Clinical Measurements: Goal: Ability to maintain clinical measurements within normal limits will improve Outcome: Progressing Goal: Will remain free from infection Outcome: Progressing Goal: Diagnostic test results will improve Outcome: Progressing Goal: Respiratory complications will improve Outcome: Progressing Goal: Cardiovascular complication will be avoided Outcome: Progressing   Problem: Activity: Goal: Risk for activity intolerance will decrease Outcome: Progressing   Problem: Nutrition: Goal: Adequate nutrition will be maintained Outcome: Progressing   Problem: Coping: Goal: Level of anxiety will decrease Outcome: Progressing   Problem: Elimination: Goal: Will not experience complications related to bowel motility Outcome: Progressing Goal: Will not experience complications related to urinary retention Outcome: Progressing   Problem: Pain Managment: Goal: General experience of comfort will improve Outcome: Progressing   Problem: Safety: Goal: Ability to remain free from injury will improve Outcome: Progressing   Problem: Skin Integrity: Goal: Risk for impaired skin integrity will decrease Outcome: Progressing

## 2021-05-21 NOTE — Discharge Summary (Signed)
  Patient ID: Barbara Johnston MRN: [id number] DOB/AGE: Jun 16, 1961 59 y.o.  Admit date: 05/19/2021 Discharge date: 05/21/2021   Discharge Diagnoses:  Principal Problem:   Mass of appendix   Procedures:Robotic assisted laparoscopic right hemicolectomy  Hospital Course: Patient with high-grade mucinous neoplasm of the appendix.  She underwent robotic assisted laparoscopic right hemicolectomy.  She has been recovering adequately.  The patient is ambulating, tolerating diet, passing gas and having bowel movement.  Pain under control.  Physical Exam HENT:     Head: Normocephalic.  Cardiovascular:     Rate and Rhythm: Normal rate and regular rhythm.  Pulmonary:     Effort: Pulmonary effort is normal.  Abdominal:     General: Abdomen is flat.  Skin:    Capillary Refill: Capillary refill takes less than 2 seconds.  Neurological:     General: No focal deficit present.     Mental Status: She is alert and oriented to person, place, and time.     Consults: None  Disposition: Discharge disposition: 01-Home or Self Care       Discharge Instructions     Diet - low sodium heart healthy   Complete by: As directed    Increase activity slowly   Complete by: As directed       Allergies as of 05/21/2021       Reactions   Codeine Other (See Comments)   Hallucinations         Medication List     TAKE these medications    acetaminophen 500 MG tablet Commonly known as: TYLENOL Take 500 mg by mouth every 6 (six) hours as needed for moderate pain or headache.   apixaban 2.5 MG Tabs tablet Commonly known as: ELIQUIS Take 2.5 mg by mouth 2 (two) times daily.   atorvastatin 20 MG tablet Commonly known as: LIPITOR Take 20 mg by mouth daily.   CALCIUM 600 + D PO Take 1 tablet by mouth daily.   docusate sodium 100 MG capsule Commonly known as: COLACE Take 100 mg by mouth daily.   furosemide 20 MG tablet Commonly known as: LASIX Take 20 mg by mouth daily.    L-Lysine HCl 500 MG Tabs Take 500 mg by mouth daily.   magnesium oxide 400 MG tablet Commonly known as: MAG-OX Take 800 mg by mouth daily.   metoprolol succinate 50 MG 24 hr tablet Commonly known as: TOPROL-XL Take 1 tablet (50 mg total) by mouth daily. Take with or immediately following a meal. What changed:  when to take this reasons to take this additional instructions   Multi-Vitamins Tabs Take 1 tablet by mouth daily.   omeprazole 20 MG capsule Commonly known as: PRILOSEC Take 20 mg by mouth daily.   potassium chloride 10 MEQ tablet Commonly known as: KLOR-CON Take 10 mEq by mouth daily.   PROBIOTIC DAILY PO Take 1 capsule by mouth daily.   vitamin E 180 MG (400 UNITS) capsule Take 400 Units by mouth daily.        Follow-up Information     Herbert Pun, MD Follow up in 2 week(s).   Specialty: General Surgery Contact information: [address] [place] [address] [phone]

## 2021-05-21 NOTE — Progress Notes (Signed)
Patient discharged to home accompanied by husband.  Patient discharged with all pertient information, prescriptions, and personal belongings.  All questions answered.  IV site d/ced.  No acute distress noted. Care relinquished.

## 2021-05-21 NOTE — Discharge Instructions (Signed)

## 2021-05-21 NOTE — Progress Notes (Signed)
Mobility Specialist - Progress Note   05/21/21 1000  Mobility  Activity Ambulated in hall  Level of Assistance Independent  Assistive Device None  Distance Ambulated (ft) 480 ft  Mobility Ambulated independently in hallway  Mobility Response Tolerated well  Mobility performed by Mobility specialist  $Mobility charge 1 Mobility    Pt ambulated in hallway independently. No complaints. HR 89, O2 95% on RA.    Kathee Delton Mobility Specialist 05/21/21, 10:55 AM

## 2021-05-25 LAB — SURGICAL PATHOLOGY

## 2021-05-26 ENCOUNTER — Encounter: Payer: Self-pay | Admitting: General Surgery

## 2021-05-28 ENCOUNTER — Encounter: Payer: Self-pay | Admitting: *Deleted

## 2021-05-31 ENCOUNTER — Inpatient Hospital Stay: Payer: BC Managed Care – PPO | Attending: Oncology | Admitting: Oncology

## 2021-05-31 ENCOUNTER — Encounter: Payer: Self-pay | Admitting: Oncology

## 2021-05-31 ENCOUNTER — Other Ambulatory Visit: Payer: Self-pay

## 2021-05-31 ENCOUNTER — Inpatient Hospital Stay: Payer: BC Managed Care – PPO

## 2021-05-31 VITALS — BP 128/81 | HR 82 | Temp 98.2°F | Wt 183.0 lb

## 2021-05-31 DIAGNOSIS — I10 Essential (primary) hypertension: Secondary | ICD-10-CM | POA: Diagnosis not present

## 2021-05-31 DIAGNOSIS — D373 Neoplasm of uncertain behavior of appendix: Secondary | ICD-10-CM | POA: Insufficient documentation

## 2021-05-31 DIAGNOSIS — N1330 Unspecified hydronephrosis: Secondary | ICD-10-CM | POA: Insufficient documentation

## 2021-05-31 DIAGNOSIS — Z90710 Acquired absence of both cervix and uterus: Secondary | ICD-10-CM | POA: Diagnosis not present

## 2021-05-31 DIAGNOSIS — Z7189 Other specified counseling: Secondary | ICD-10-CM | POA: Insufficient documentation

## 2021-05-31 DIAGNOSIS — K388 Other specified diseases of appendix: Secondary | ICD-10-CM

## 2021-05-31 NOTE — Progress Notes (Signed)
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 Resected and retrieved. - Non-bleeding internal hemorrhoids  04/02/2021, patient had CT abdomen pelvis with contrast performed for evaluation of abdominal bloating after colonoscopy CT scan showed severely enlarged appendix, without surrounding inflammation.  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  Mild to moderate right hydronephrosis was noted without obstructing calculus or ureteral dilatation.  Aortic atherosclerosis. 04/21/2021, patient underwent a craniectomy with partial cecectomy. 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  Grade 2, moderately differentiated, no lymphovascular invasion.  Acellular mucin invades visceral peritoneum.  Margins were negative.  Patient's case was presented by Dr. Peyton Najjar on tumor board.  Consensus recommendation was to proceed with right colectomy. 05/12/2021, CT chest with contrast showed no acute abnormality was noted.  Moderate coronary artery calcifications suggesting CAD.  Aortic atherosclerosis 05/19/2021, patient is status post robotic assisted laparoscopic right colectomy.  Patient was sent to establish care with oncology for discussion of post chemotherapy management/surveillance.  Patient reports some soreness and bruising at the site of laparoscopy.  Otherwise she feels well.  No new  complaint .  Review of Systems  Constitutional:  Negative for appetite change, chills, fatigue and fever.  HENT:   Negative for hearing loss and voice change.   Eyes:  Negative for eye problems.  Respiratory:  Negative for chest tightness and cough.   Cardiovascular:  Negative for chest pain.  Gastrointestinal:  Negative for abdominal distention, abdominal pain and blood in stool.  Endocrine: Negative for hot flashes.  Genitourinary:  Negative for difficulty urinating and frequency.   Musculoskeletal:  Negative for arthralgias.  Skin:  Negative for itching and rash.  Neurological:  Negative for extremity weakness.  Hematological:  Negative for adenopathy.  Psychiatric/Behavioral:  Negative for confusion.    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   Atrial fibrillation ([place])    a.) CHA2DS2-VASc = 3 (sex, HTN, aortic plaque). b.) rate/ryhthm maintained on metoprolol; chronically anticoagulated with reduced dose apixaban. c.) Ablation 05/08/2008. d.) DCCV 01/21/2020. e.) Repeat cardiac ablation 02/26/2020.   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   Current use of long term anticoagulation    a.) Apixaban   Gastritis    Gastrointestinal ulcer    GERD (gastroesophageal reflux disease)    Hyperlipidemia    Hypertension    Motion sickness    Obesity    PONV (postoperative nausea and vomiting)     SURGICAL HISTORY: Past Surgical History:  Procedure Laterality Date   ABDOMINAL HYSTERECTOMY     CARDIAC ELECTROPHYSIOLOGY MAPPING AND ABLATION N/A 05/08/2008   Procedure: CARDIAC ABLATION WITH PVI x 4; Location: [facility] N/A 02/26/2020   Procedure: CARDIAC ABLATION; Location: [facility]  CARDIOVERSION N/A 01/21/2020   Procedure:  CARDIOVERSION;  Surgeon: Corey Skains, MD;  Location: [facility] ORS;  Service: Cardiovascular;  Laterality: N/A;   CHOLECYSTECTOMY     COLONOSCOPY WITH PROPOFOL N/A 03/01/2021   Procedure: COLONOSCOPY WITH PROPOFOL;  Surgeon: Annamaria Helling, DO;  Location: [facility] ENDOSCOPY;  Service: Gastroenterology;  Laterality: N/A;   OOPHORECTOMY     XI ROBOTIC LAPAROSCOPIC ASSISTED APPENDECTOMY  04/21/2021   Procedure: XI ROBOT ASSISTED LAPAROSCOPIC APPENDECTOMY;  Surgeon: Herbert Pun, MD;  Location: [facility] ORS;  Service: General;;    SOCIAL HISTORY: Social History   Socioeconomic History   Marital status: Married    Spouse name: Not on file   Number of children: Not on file   Years of education: Not on file   Highest education level: Not on file  Occupational History    Employer: [employer]  Tobacco Use   Smoking status: Never   Smokeless tobacco: Never  Vaping Use   Vaping Use: Never used  Substance and Sexual Activity   Alcohol use: Not Currently   Drug use: Never   Sexual activity: Not on file  Other Topics Concern   Not on file  Social History Narrative   Not on file   Social Determinants of Health   Financial Resource Strain: Not on file  Food Insecurity: Not on file  Transportation Needs: Not on file  Physical Activity: Not on file  Stress: Not on file  Social Connections: Not on file  Intimate Partner Violence: Not on file    FAMILY HISTORY: Family History  Problem Relation Age of Onset   Heart disease Father    Heart disease Brother    Breast cancer Neg Hx     ALLERGIES:  is allergic to codeine.  MEDICATIONS:  Current Outpatient Medications  Medication Sig Dispense Refill   acetaminophen (TYLENOL) 500 MG tablet Take 500 mg by mouth every 6 (six) hours as needed for moderate pain or headache.     apixaban (ELIQUIS) 2.5 MG TABS tablet Take 2.5 mg by mouth 2 (two) times daily.     atorvastatin (LIPITOR) 20 MG tablet Take 20 mg by mouth daily.       Calcium Carb-Cholecalciferol (CALCIUM 600 + D PO) Take 1 tablet by mouth daily.     docusate sodium (COLACE) 100 MG capsule Take 100 mg by mouth daily.     furosemide (LASIX) 20 MG tablet Take 20 mg by mouth daily.      L-Lysine HCl 500 MG TABS Take 500 mg by mouth daily.      magnesium oxide (MAG-OX) 400 MG tablet Take 800 mg by mouth daily.      Multiple Vitamin (MULTI-VITAMINS) TABS Take 1 tablet by mouth daily.      omeprazole (PRILOSEC) 20 MG capsule Take 20 mg by mouth daily.      potassium chloride (K-DUR) 10 MEQ tablet Take 10 mEq by mouth daily.      Probiotic Product (PROBIOTIC DAILY PO) Take 1 capsule by mouth daily.     vitamin E 400 UNIT capsule Take 400 Units by mouth daily.      metoprolol succinate (TOPROL-XL) 50 MG 24 hr tablet Take 1 tablet (50 mg total) by mouth daily. Take with or immediately following a meal. (Patient taking differently: Take 50 mg by mouth daily as needed (AFIB). Take with or immediately following a meal. Patient reports she had an ablation and only takes this for breakthrough rapid rate. She reports she hasn't needed  in about two months.) 90 tablet 4   No current facility-administered medications for this visit.     PHYSICAL EXAMINATION: ECOG PERFORMANCE STATUS: 0 - Asymptomatic Vitals:   05/31/21 1124  BP: 128/81  Pulse: 82  Temp: 98.2 F (36.8 C)   Filed Weights   05/31/21 1124  Weight: 183 lb (83 kg)    Physical Exam Constitutional:      General: She is not in acute distress. HENT:     Head: Normocephalic and atraumatic.  Eyes:     General: No scleral icterus. Cardiovascular:     Rate and Rhythm: Normal rate and regular rhythm.     Heart sounds: Normal heart sounds.  Pulmonary:     Effort: Pulmonary effort is normal. No respiratory distress.     Breath sounds: No wheezing.  Abdominal:     General: Bowel sounds are normal. There is no distension.     Palpations: Abdomen is soft.  Musculoskeletal:        General: No  deformity. Normal range of motion.     Cervical back: Normal range of motion and neck supple.  Skin:    General: Skin is warm and dry.     Findings: No erythema or rash.     Comments: Abdominal wall status post laparoscopic, healing well.  Focal bruising from Lovenox injections during admission.  Neurological:     Mental Status: She is alert and oriented to person, place, and time. Mental status is at baseline.     Cranial Nerves: No cranial nerve deficit.     Coordination: Coordination normal.  Psychiatric:        Mood and Affect: Mood normal.    LABORATORY DATA:  I have reviewed the data as listed Lab Results  Component Value Date   WBC 12.0 (H) 05/20/2021   HGB 13.4 05/20/2021   HCT 39.9 05/20/2021   MCV 90.1 05/20/2021   PLT 181 05/20/2021   Recent Labs    04/02/21 0821 05/20/21 0349  NA  --  138  K  --  3.8  CL  --  108  CO2  --  26  GLUCOSE  --  110*  BUN  --  11  CREATININE 0.60 0.61  CALCIUM  --  8.4*  GFRNONAA  --  >60   Iron/TIBC/Ferritin/ %Sat No results found for: IRON, TIBC, FERRITIN, IRONPCTSAT    RADIOGRAPHIC STUDIES: I have personally reviewed the radiological images as listed and agreed with the findings in the report. CT CHEST W CONTRAST  Result Date: 05/11/2021 CLINICAL DATA:  Preop for colectomy. EXAM: CT CHEST WITH CONTRAST TECHNIQUE: Multidetector CT imaging of the chest was performed during intravenous contrast administration. CONTRAST:  74mL OMNIPAQUE IOHEXOL 300 MG/ML  SOLN COMPARISON:  Oct 26, 2007. FINDINGS: Cardiovascular: Atherosclerosis of thoracic aorta is noted without aneurysm or dissection. Normal cardiac size. No pericardial effusion. Coronary artery calcifications are noted. Mediastinum/Nodes: No enlarged mediastinal, hilar, or axillary lymph nodes. Thyroid gland, trachea, and esophagus demonstrate no significant findings. Lungs/Pleura: Lungs are clear. No pleural effusion or pneumothorax. Upper Abdomen: No acute abnormality.  Musculoskeletal: No chest wall abnormality. No acute or significant osseous findings. IMPRESSION: No acute abnormality is noted. Moderate coronary artery calcifications are noted suggesting coronary artery disease. Aortic Atherosclerosis (ICD10-I70.0). Electronically Signed   By: Marijo Conception M.D.   On: 05/11/2021 11:55   MM 3D SCREEN BREAST BILATERAL  Result Date: 05/10/2021 CLINICAL DATA:  Screening. EXAM: DIGITAL SCREENING BILATERAL MAMMOGRAM WITH TOMOSYNTHESIS AND CAD  TECHNIQUE: Bilateral screening digital craniocaudal and mediolateral oblique mammograms were obtained. Bilateral screening digital breast tomosynthesis was performed. The images were evaluated with computer-aided detection. COMPARISON:  Previous exam(s). ACR Breast Density Category b: There are scattered areas of fibroglandular density. FINDINGS: There are no findings suspicious for malignancy. IMPRESSION: No mammographic evidence of malignancy. A result letter of this screening mammogram will be mailed directly to the patient. RECOMMENDATION: Screening mammogram in one year. (Code:SM-B-01Y) BI-RADS CATEGORY  1: Negative. Electronically Signed   By: Ileana Roup M.D.   On: 05/10/2021 16:37     ASSESSMENT & PLAN:  1. Appendiceal tumor   2. Goals of care, counseling/discussion   3. Hydronephrosis of right kidney    #High-grade appendiceal mucinous neoplasm HAMN, pT4a pN0 M0 We discussed about the diagnosis, nature of the disease.  Because HAMN is a newly created pathological category, very limited data on the behavior of this condition, and this is a highly controversial area.  Although HAMN may appear to have a more aggressive course then LAMNs, currently the clinical management is closer to that of LAMN, then that of mucinous adenocarcinomas. Patient does not have evidence of distant peritoneal spread. I recommend repeat CT scan in 6 months.  Followed by annually for 4 years and then every 2 years and 2-year 10.  She understands  that there is no consensus about post surgery management and current recommendation was based on up-to-date expert opinion.  Check CEA, Follow-up in 6 months after CT scan.  Right kidney hydronephrosis, incidental finding on recent CT scan.  Recommend patient to seek urology advice.  Patient will further discuss with Dr. Peyton Najjar.  I printed her a copy of her CT results.   Orders Placed This Encounter  Procedures   CT Abdomen Pelvis W Contrast    Standing Status:   Future    Standing Expiration Date:   05/31/2022    Order Specific Question:   If indicated for the ordered procedure, I authorize the administration of contrast media per Radiology protocol    Answer:   Yes    Order Specific Question:   Preferred imaging location?    Answer:   [facility]    Order Specific Question:   Is Oral Contrast requested for this exam?    Answer:   Yes, Per Radiology protocol   CEA    Standing Status:   Future    Standing Expiration Date:   05/31/2022   Comprehensive metabolic panel    Standing Status:   Future    Standing Expiration Date:   05/31/2022   CBC with Differential/Platelet    Standing Status:   Future    Standing Expiration Date:   05/31/2022   CEA    Standing Status:   Future    Number of Occurrences:   1    Standing Expiration Date:   05/31/2022    All questions were answered. The patient knows to call the clinic with any problems questions or concerns.   Herbert Pun, *    Return of visit:  Thank you for this kind referral and the opportunity to participate in the care of this patient. A copy of today's note is routed to referring provider   Earlie Server, MD, PhD [facility] Hematology Oncology 05/31/2021

## 2021-06-01 LAB — CEA: CEA: 5.5 ng/mL — ABNORMAL HIGH (ref 0.0–4.7)

## 2021-06-25 ENCOUNTER — Encounter: Payer: Self-pay | Admitting: Urology

## 2021-06-25 ENCOUNTER — Ambulatory Visit (INDEPENDENT_AMBULATORY_CARE_PROVIDER_SITE_OTHER): Payer: BC Managed Care – PPO | Admitting: Urology

## 2021-06-25 ENCOUNTER — Other Ambulatory Visit: Payer: Self-pay

## 2021-06-25 VITALS — BP 130/80 | HR 72 | Ht 62.0 in | Wt 183.0 lb

## 2021-06-25 DIAGNOSIS — N135 Crossing vessel and stricture of ureter without hydronephrosis: Secondary | ICD-10-CM | POA: Diagnosis not present

## 2021-06-25 DIAGNOSIS — Q6211 Congenital occlusion of ureteropelvic junction: Secondary | ICD-10-CM | POA: Diagnosis not present

## 2021-06-25 LAB — URINALYSIS, COMPLETE
Bilirubin, UA: NEGATIVE
Glucose, UA: NEGATIVE
Ketones, UA: NEGATIVE
Leukocytes,UA: NEGATIVE
Nitrite, UA: NEGATIVE
Protein,UA: NEGATIVE
RBC, UA: NEGATIVE
Specific Gravity, UA: <1.005 — ABNORMAL LOW (ref 1.005–1.030)
Urobilinogen, Ur: 0.2 mg/dL (ref 0.2–1.0)
pH, UA: 5.5 (ref 5.0–7.5)

## 2021-06-25 LAB — MICROSCOPIC EXAMINATION: Bacteria, UA: NONE SEEN

## 2021-06-25 NOTE — Progress Notes (Signed)
06/25/2021 9:49 AM   Barbara Johnston Mar 06, 1961 [id number]  Referring provider: Herbert Pun, MD [address] [place],  [place] [address]  Chief Complaint  Patient presents with   Hydronephrosis    HPI: Barbara Johnston is a 61 y.o. female referred for evaluation of right hydronephrosis.  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   Atrial fibrillation ([place])    a.) CHA2DS2-VASc = 3 (sex, HTN, aortic plaque). b.) rate/ryhthm maintained on metoprolol; chronically anticoagulated with reduced dose apixaban. c.) Ablation 05/08/2008. d.) DCCV 01/21/2020. e.) Repeat cardiac ablation 02/26/2020.   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   Hyperlipidemia    Hypertension    Motion sickness    Obesity    PONV (postoperative nausea and vomiting)     Surgical History: Past Surgical History:  Procedure Laterality Date   ABDOMINAL HYSTERECTOMY     CARDIAC ELECTROPHYSIOLOGY MAPPING AND ABLATION N/A 05/08/2008   Procedure: CARDIAC ABLATION WITH PVI x 4; Location: [place] AND ABLATION N/A 02/26/2020   Procedure: CARDIAC ABLATION; Location: [facility]   CARDIOVERSION N/A 01/21/2020   Procedure: CARDIOVERSION;  Surgeon: Corey Skains, MD;  Location: [facility] OR[facility];  Service: Cardiovascular;  Laterality:  N/A;   CHOLECYSTECTOMY     COLONOSCOPY WITH PROPOFOL N/A 03/01/2021   Procedure: COLONOSCOPY WITH PROPOFOL;  Surgeon: Annamaria Helling, DO;  Location: [facility];  Service: Gastroenterology;  Laterality: N/A;   OOPHORECTOMY     XI ROBOTIC LAPAROSCOPIC ASSISTED APPENDECTOMY  04/21/2021   Procedure: XI ROBOT ASSISTED LAPAROSCOPIC APPENDECTOMY;  Surgeon: Herbert Pun, MD;  Location: [facility];  Service: General;;    Home Medications:  Allergies as of 06/25/2021       Reactions   Codeine Other (See Comments)   Hallucinations         Medication List        Accurate as of June 25, 2021  9:49 AM. If you have any questions, ask your nurse or doctor.          acetaminophen 500 MG tablet Commonly known as: TYLENOL Take 500 mg by mouth every 6 (six) hours as needed for moderate pain or headache.   apixaban 2.5 MG Tabs tablet Commonly known as: ELIQUIS Take 2.5 mg by mouth 2 (two) times daily.   atorvastatin 20 MG tablet Commonly known as: LIPITOR Take 20 mg by mouth daily.   CALCIUM 600 + D PO Take 1 tablet by mouth daily.   docusate sodium 100 MG capsule Commonly known as: COLACE Take 100 mg by mouth daily.   furosemide 20 MG tablet Commonly known as: LASIX Take 20 mg by mouth daily.   L-Lysine HCl 500 MG Tabs Take 500 mg by mouth daily.   magnesium oxide 400 MG tablet Commonly known as: MAG-OX Take 800 mg by mouth daily.   metoprolol succinate 50 MG 24 hr tablet Commonly known as:  TOPROL-XL Take 1 tablet (50 mg total) by mouth daily. Take with or immediately following a meal. What changed:  when to take this reasons to take this additional instructions   Multi-Vitamins Tabs Take 1 tablet by mouth daily.   omeprazole 20 MG capsule Commonly known as: PRILOSEC Take 20 mg by mouth daily.   potassium chloride 10 MEQ tablet Commonly known as: KLOR-CON Take 10 mEq by mouth daily.   PROBIOTIC DAILY PO Take 1 capsule by mouth daily.    vitamin E 180 MG (400 UNITS) capsule Take 400 Units by mouth daily.        Allergies:  Allergies  Allergen Reactions   Codeine Other (See Comments)    Hallucinations     Family History: Family History  Problem Relation Age of Onset   Heart disease Father    Heart disease Brother    Breast cancer Neg Hx     Social History:  reports that she has never smoked. She has never used smokeless tobacco. She reports that she does not currently use alcohol. She reports that she does not use drugs.   Physical Exam: BP 130/80    Pulse 72    Ht 5\' 2"  (1.575 m)    Wt 183 lb (83 kg)    BMI 33.47 kg/m   Constitutional:  Alert and oriented, No acute distress. HEENT: [place] AT, moist mucus membranes.  Trachea midline, no masses. Cardiovascular: No clubbing, cyanosis, or edema. Respiratory: Normal respiratory effort, no increased work of breathing. Psychiatric: Normal mood and affect.  Laboratory Data:  Urinalysis Appearance-yellow clear Dipstick-negative Microscopy negative  Pertinent Imaging: CT abdomen pelvis 04/02/2021 was personally reviewed and interpreted.  Moderate right hydronephrosis and normal-appearing ureter consistent with a UPJ obstruction.  There is a high insertion of the ureter on the renal pelvis and a crossing vessel in this region   Assessment & Plan:    1.  Right UPJ obstruction Asymptomatic Lasix renal scan to assess for differential function and degree of obstruction She will be notified with the results   [name], Barbara [address] Barbara Johnston, [place] [address] [phone]

## 2021-06-29 ENCOUNTER — Telehealth: Payer: Self-pay

## 2021-06-29 DIAGNOSIS — D373 Neoplasm of uncertain behavior of appendix: Secondary | ICD-10-CM

## 2021-06-29 NOTE — Telephone Encounter (Signed)
Lab order added. Please call pt to schedule lab in 4 weeks for repeat CEA.

## 2021-06-29 NOTE — Telephone Encounter (Signed)
-----   Message from Earlie Server, MD sent at 06/28/2021 10:41 PM EST ----- Please arrange her to repeat CEA in 4 weeks.

## 2021-07-12 ENCOUNTER — Ambulatory Visit
Admission: RE | Admit: 2021-07-12 | Discharge: 2021-07-12 | Disposition: A | Discharge status: Home / Self Care | Payer: BC Managed Care – PPO | Source: Ambulatory Visit | Attending: Urology | Admitting: Urology

## 2021-07-12 ENCOUNTER — Other Ambulatory Visit: Payer: Self-pay

## 2021-07-12 DIAGNOSIS — Q6211 Congenital occlusion of ureteropelvic junction: Secondary | ICD-10-CM | POA: Diagnosis present

## 2021-07-12 MED ORDER — TECHNETIUM TC 99M MERTIATIDE
5.1500 | Freq: Once | INTRAVENOUS | Status: AC
  Administered 2021-07-12: 5.15 via INTRAVENOUS

## 2021-07-12 MED ORDER — FUROSEMIDE 10 MG/ML IJ SOLN
41.5000 mg | Freq: Once | INTRAMUSCULAR | Status: AC
  Administered 2021-07-12: 42 mg via INTRAVENOUS
  Filled 2021-07-12: qty 4.2

## 2021-07-18 ENCOUNTER — Other Ambulatory Visit: Payer: Self-pay | Admitting: Urology

## 2021-07-18 DIAGNOSIS — Q6211 Congenital occlusion of ureteropelvic junction: Secondary | ICD-10-CM

## 2021-07-19 ENCOUNTER — Encounter: Payer: Self-pay | Admitting: *Deleted

## 2021-07-26 ENCOUNTER — Inpatient Hospital Stay: Payer: BC Managed Care – PPO

## 2021-08-03 ENCOUNTER — Telehealth: Payer: Self-pay | Admitting: *Deleted

## 2021-08-03 NOTE — Telephone Encounter (Signed)
Patient needs to change her scheduled appointment.

## 2021-08-06 ENCOUNTER — Other Ambulatory Visit: Payer: Self-pay

## 2021-08-06 ENCOUNTER — Inpatient Hospital Stay: Payer: BC Managed Care – PPO | Attending: Oncology

## 2021-08-06 DIAGNOSIS — I700 Atherosclerosis of aorta: Secondary | ICD-10-CM | POA: Diagnosis not present

## 2021-08-06 DIAGNOSIS — D373 Neoplasm of uncertain behavior of appendix: Secondary | ICD-10-CM

## 2021-08-06 DIAGNOSIS — R970 Elevated carcinoembryonic antigen [CEA]: Secondary | ICD-10-CM | POA: Diagnosis not present

## 2021-08-06 DIAGNOSIS — I10 Essential (primary) hypertension: Secondary | ICD-10-CM | POA: Insufficient documentation

## 2021-08-06 DIAGNOSIS — N1330 Unspecified hydronephrosis: Secondary | ICD-10-CM | POA: Insufficient documentation

## 2021-08-06 DIAGNOSIS — D121 Benign neoplasm of appendix: Secondary | ICD-10-CM | POA: Insufficient documentation

## 2021-08-06 DIAGNOSIS — Z90710 Acquired absence of both cervix and uterus: Secondary | ICD-10-CM | POA: Insufficient documentation

## 2021-08-07 LAB — CEA: CEA: 13.2 ng/mL — ABNORMAL HIGH (ref 0.0–4.7)

## 2021-08-09 ENCOUNTER — Telehealth: Payer: Self-pay | Admitting: *Deleted

## 2021-08-09 ENCOUNTER — Encounter: Payer: Self-pay | Admitting: Oncology

## 2021-08-09 ENCOUNTER — Other Ambulatory Visit: Payer: Self-pay

## 2021-08-09 DIAGNOSIS — D373 Neoplasm of uncertain behavior of appendix: Secondary | ICD-10-CM

## 2021-08-09 NOTE — Telephone Encounter (Signed)
Spoke with patient and informed her of Dr. Collie Siad recommendation to get CT chest abdomen pelvis STAT due to her CEA levels being more elevated. Advised she would see Dr. Tasia Catchings 2 days after scan. Informed patient our scheduler would reach out for scheduling. Patient verbalized understanding.   Abby, please schedule patient for CT c/a/p w contrast STAT. Please inform patient of appt. Thanks

## 2021-08-09 NOTE — Telephone Encounter (Signed)
Please schedule her MD 2 days after scan as well

## 2021-08-09 NOTE — Telephone Encounter (Signed)
Patient called asking about her lab results and if someone is going to call her because of the rise in results. Her next appointment is not until June 14. Please advise.  CEA Order: [id number] Status: Final result    Visible to patient: Yes (seen)    Next appt: 11/29/2021 at 09:00 AM in Oncology ([facility])    Dx: Appendiceal tumor    0 Result Notes      Component Ref Range & Units 3 d ago 2 mo ago  CEA 0.0 - 4.7 ng/mL 13.2 High   5.5 High  CM   Comment: (NOTE)

## 2021-08-10 ENCOUNTER — Ambulatory Visit
Admission: RE | Admit: 2021-08-10 | Discharge: 2021-08-10 | Disposition: A | Discharge status: Home / Self Care | Payer: BC Managed Care – PPO | Source: Ambulatory Visit | Attending: Oncology | Admitting: Oncology

## 2021-08-10 ENCOUNTER — Other Ambulatory Visit: Payer: Self-pay

## 2021-08-10 DIAGNOSIS — D373 Neoplasm of uncertain behavior of appendix: Secondary | ICD-10-CM | POA: Insufficient documentation

## 2021-08-10 LAB — POCT I-STAT CREATININE: Creatinine, Ser: 0.8 mg/dL (ref 0.44–1.00)

## 2021-08-10 MED ORDER — IOHEXOL 300 MG/ML  SOLN
100.0000 mL | Freq: Once | INTRAMUSCULAR | Status: AC | PRN
  Administered 2021-08-10: 100 mL via INTRAVENOUS

## 2021-08-12 ENCOUNTER — Ambulatory Visit: Payer: BC Managed Care – PPO | Admitting: Oncology

## 2021-08-13 ENCOUNTER — Other Ambulatory Visit: Payer: Self-pay

## 2021-08-13 ENCOUNTER — Encounter: Payer: Self-pay | Admitting: Oncology

## 2021-08-13 ENCOUNTER — Inpatient Hospital Stay (HOSPITAL_BASED_OUTPATIENT_CLINIC_OR_DEPARTMENT_OTHER): Payer: BC Managed Care – PPO | Admitting: Oncology

## 2021-08-13 VITALS — BP 145/78 | HR 75 | Temp 97.3°F | Resp 16 | Wt 193.8 lb

## 2021-08-13 DIAGNOSIS — N1330 Unspecified hydronephrosis: Secondary | ICD-10-CM | POA: Diagnosis not present

## 2021-08-13 DIAGNOSIS — R970 Elevated carcinoembryonic antigen [CEA]: Secondary | ICD-10-CM | POA: Diagnosis not present

## 2021-08-13 DIAGNOSIS — D121 Benign neoplasm of appendix: Secondary | ICD-10-CM | POA: Diagnosis not present

## 2021-08-13 DIAGNOSIS — D373 Neoplasm of uncertain behavior of appendix: Secondary | ICD-10-CM

## 2021-08-13 NOTE — Progress Notes (Signed)
Pt in for follow up and CT results. Pt reports she was having some dizziness this morning but it is resolved now.

## 2021-08-13 NOTE — Progress Notes (Signed)
Hematology/Oncology Progress note [phone] [phone]         Patient Care Team: Rusty Aus, MD as PCP - General (Internal Medicine)  REFERRING PROVIDER: Rusty Aus, MD  CHIEF COMPLAINTS/REASON FOR VISIT:  Follow up of  HAMN  HISTORY OF PRESENTING ILLNESS:   Barbara Johnston is a  61 y.o.  female with PMH listed below was seen in consultation at the request of  Rusty Aus, MD  for evaluation of [facility]  03/01/2021, colonoscopy showed Perianal skin tags found on perianal exam.- Two 2 to 3 mm polyps in the rectum and in the transverse colon, removed with a cold biopsy forceps. Resected and retrieved. - Non-bleeding internal hemorrhoids  04/02/2021, patient had CT abdomen pelvis with contrast performed for evaluation of abdominal bloating after colonoscopy CT scan showed severely enlarged appendix, without surrounding inflammation.  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  Mild to moderate right hydronephrosis was noted without obstructing calculus or ureteral dilatation.  Aortic atherosclerosis. 04/21/2021, patient underwent a craniectomy with partial cecectomy. 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  Grade 2, moderately differentiated, no lymphovascular invasion.  Acellular mucin invades visceral peritoneum.  Margins were negative.  Patient's case was presented by Dr. Peyton Najjar on tumor board.  Consensus recommendation was to proceed with right colectomy. 05/12/2021, CT chest with contrast showed no acute abnormality was noted.  Moderate coronary artery calcifications suggesting CAD.  Aortic atherosclerosis 05/19/2021, patient is status post robotic assisted laparoscopic right colectomy.  Patient was sent to establish care with oncology for discussion of post chemotherapy management/surveillance.  #High-grade appendiceal mucinous neoplasm HAMN, pT4a pN0 M0 We discussed about the diagnosis, nature of the  disease.  Because HAMN is a newly created pathological category, very limited data on the behavior of this condition, and this is a highly controversial area.  Although HAMN may appear to have a more aggressive course then LAMNs, currently the clinical management is closer to that of LAMN, then that of mucinous adenocarcinomas. Patient does not have evidence of distant peritoneal spread.  INTERVAL HISTORY Barbara Johnston is a 61 y.o. female who has above history reviewed by me today presents for follow up visit for management of HAMN Her repeat CEA on 08/06/21 was 13.2 08/10/21 CT chest abdomen pelvis w contrast showed No acute findings.  No mass or adenopathy. Stable 3 mm right pulmonary nodules.  Persistent right hydronephrosis  She reports that she had COVID 19 prior to her blood work and also was on [place].  She has no new complaints.   She was seen by urology and had a normal right lasix renal scan. indicating an absence of significant urinary outflow obstruction.     Review of Systems  Constitutional:  Negative for appetite change, chills, fatigue and fever.  HENT:   Negative for hearing loss and voice change.   Eyes:  Negative for eye problems.  Respiratory:  Negative for chest tightness and cough.   Cardiovascular:  Negative for chest pain.  Gastrointestinal:  Negative for abdominal distention, abdominal pain and blood in stool.  Endocrine: Negative for hot flashes.  Genitourinary:  Negative for difficulty urinating and frequency.   Musculoskeletal:  Negative for arthralgias.  Skin:  Negative for itching and rash.  Neurological:  Negative for extremity weakness.  Hematological:  Negative for adenopathy.  Psychiatric/Behavioral:  Negative for confusion.    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   Atrial fibrillation ([place])    a.) CHA2DS2-VASc = 3 (sex, HTN, aortic plaque). b.) rate/ryhthm maintained on metoprolol; chronically anticoagulated with reduced dose apixaban. c.) Ablation 05/08/2008. d.) DCCV 01/21/2020. e.) Repeat cardiac ablation 02/26/2020.   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   Hyperlipidemia    Hypertension    Motion sickness    Obesity    PONV (postoperative nausea and vomiting)     SURGICAL HISTORY: Past Surgical History:  Procedure Laterality Date   ABDOMINAL HYSTERECTOMY     CARDIAC ELECTROPHYSIOLOGY MAPPING AND ABLATION N/A 05/08/2008   Procedure: CARDIAC ABLATION WITH PVI x 4; Location: [place] N/A 02/26/2020   Procedure: CARDIAC ABLATION; Location: [facility]   CARDIOVERSION N/A 01/21/2020   Procedure: CARDIOVERSION;  Surgeon: Corey Skains, MD;  Location: [facility] ORS;  Service: Cardiovascular;  Laterality: N/A;   CHOLECYSTECTOMY     COLONOSCOPY WITH PROPOFOL N/A 03/01/2021   Procedure: COLONOSCOPY WITH PROPOFOL;  Surgeon: Annamaria Helling, DO;  Location: [facility] END[facility]COPY;  Service: Gastroenterology;  Laterality: N/A;   OOPHORECTOMY     XI ROBOTIC LAPAROSCOPIC ASSISTED APPENDECTOMY  04/21/2021   Procedure: XI ROBOT ASSISTED LAPAROSCOPIC APPENDECTOMY;  Surgeon: Herbert Pun, MD;  Location: [facility] ORS;  Service: General;;    SOCIAL HISTORY: Social History   Socioeconomic History   Marital status: Married    Spouse name: Not on file   Number of children: Not on file   Years of education: Not on file   Highest education level: Not on file  Occupational History    Employer: [employer]  Tobacco Use   Smoking status: Never   Smokeless tobacco: Never  Vaping Use   Vaping Use: Never used  Substance  and Sexual Activity   Alcohol use: Not Currently   Drug use: Never   Sexual activity: Not on file  Other Topics Concern   Not on file  Social History Narrative   Not on file   Social Determinants of Health   Financial Resource Strain: Not on file  Food Insecurity: Not on file  Transportation Needs: Not on file  Physical Activity: Not on file  Stress: Not on file  Social Connections: Not on file  Intimate Partner Violence: Not on file    FAMILY HISTORY: Family History  Problem Relation Age of Onset   Heart disease Father    Heart disease Brother    Breast cancer Neg Hx     ALLERGIES:  is allergic to codeine.  MEDICATIONS:  Current Outpatient Medications  Medication Sig Dispense Refill   acetaminophen (TYLENOL) 500 MG tablet Take 500 mg by mouth every 6 (six) hours as needed for moderate pain or headache.     apixaban (ELIQUIS) 2.5 MG TABS tablet Take 2.5 mg by mouth 2 (two) times daily.     atorvastatin (LIPITOR) 20 MG tablet Take 20 mg by mouth daily.      Calcium Carb-Cholecalciferol (CALCIUM 600 + D PO) Take 1 tablet by mouth daily.     docusate sodium (COLACE) 100 MG capsule Take 100 mg by mouth daily.     furosemide (LASIX) 20 MG tablet Take 20 mg by mouth daily.  L-Lysine HCl 500 MG TABS Take 500 mg by mouth daily.      magnesium oxide (MAG-OX) 400 MG tablet Take 800 mg by mouth daily.      Multiple Vitamin (MULTI-VITAMINS) TABS Take 1 tablet by mouth daily.      omeprazole (PRILOSEC) 20 MG capsule Take 20 mg by mouth daily.      potassium chloride (K-DUR) 10 MEQ tablet Take 10 mEq by mouth daily.      Probiotic Product (PROBIOTIC DAILY PO) Take 1 capsule by mouth daily.     vitamin E 400 UNIT capsule Take 400 Units by mouth daily.      metoprolol succinate (TOPROL-XL) 50 MG 24 hr tablet Take 1 tablet (50 mg total) by mouth daily. Take with or immediately following a meal. (Patient not taking: Reported on 08/13/2021) 90 tablet 4   No current  facility-administered medications for this visit.     PHYSICAL EXAMINATION: ECOG PERFORMANCE STATUS: 0 - Asymptomatic Vitals:   08/13/21 1036  BP: (!) 145/78  Pulse: 75  Resp: 16  Temp: (!) 97.3 F (36.3 C)  SpO2: 98%   Filed Weights   08/13/21 1036  Weight: 193 lb 12.8 oz (87.9 kg)    Physical Exam Constitutional:      General: She is not in acute distress. HENT:     Head: Normocephalic and atraumatic.  Eyes:     General: No scleral icterus. Cardiovascular:     Rate and Rhythm: Normal rate and regular rhythm.     Heart sounds: Normal heart sounds.  Pulmonary:     Effort: Pulmonary effort is normal. No respiratory distress.     Breath sounds: No wheezing.  Abdominal:     General: Bowel sounds are normal. There is no distension.     Palpations: Abdomen is soft.  Musculoskeletal:        General: No deformity. Normal range of motion.     Cervical back: Normal range of motion and neck supple.  Skin:    General: Skin is warm and dry.     Findings: No erythema or rash.  Neurological:     Mental Status: She is alert and oriented to person, place, and time. Mental status is at baseline.     Cranial Nerves: No cranial nerve deficit.     Coordination: Coordination normal.  Psychiatric:        Mood and Affect: Mood normal.    LABORATORY DATA:  I have reviewed the data as listed Lab Results  Component Value Date   WBC 12.0 (H) 05/20/2021   HGB 13.4 05/20/2021   HCT 39.9 05/20/2021   MCV 90.1 05/20/2021   PLT 181 05/20/2021   Recent Labs    04/02/21 0821 05/20/21 0349 08/10/21 0933  NA  --  138  --   K  --  3.8  --   CL  --  108  --   CO2  --  26  --   GLUCOSE  --  110*  --   BUN  --  11  --   CREATININE 0.60 0.61 0.80  CALCIUM  --  8.4*  --   GFRNONAA  --  >60  --     Iron/TIBC/Ferritin/ %Sat No results found for: IRON, TIBC, FERRITIN, IRONPCTSAT    RADIOGRAPHIC STUDIES: I have personally reviewed the radiological images as listed and agreed with  the findings in the report. CT CHEST ABDOMEN PELVIS W CONTRAST  Result Date: 08/10/2021 CLINICAL DATA:  Elevated  CEA, appendiceal tumor post appendectomy and partial colectomy. EXAM: CT CHEST, ABDOMEN, AND PELVIS WITH CONTRAST TECHNIQUE: Multidetector CT imaging of the chest, abdomen and pelvis was performed following the standard protocol during bolus administration of intravenous contrast. RADIATION DOSE REDUCTION: This exam was performed according to the departmental dose-optimization program which includes automated exposure control, adjustment of the mA and/or kV according to patient size and/or use of iterative reconstruction technique. CONTRAST:  114mL OMNIPAQUE IOHEXOL 300 MG/ML  SOLN COMPARISON:  05/11/2021 and previous FINDINGS: CT CHEST FINDINGS Cardiovascular: Heart size normal. No pericardial effusion. Scattered coronary calcifications. Scattered aortic atheromatous calcified plaque. Mediastinum/Nodes: No mass or adenopathy. Lungs/Pleura: No pleural effusion. Azygos fissure, an anatomic variant. 3 mm subpleural nodule, right lower lobe (Im82,Se3) , stable since 05/11/2021. 3 mm subpleural nodule, right middle lobe (Im92,Se3) , stable. No new nodule or infiltrate. Musculoskeletal: Anterior vertebral endplate spurring at multiple levels in the lower thoracic spine. CT ABDOMEN PELVIS FINDINGS Hepatobiliary: No focal liver abnormality is seen. Status post cholecystectomy. No biliary dilatation. Pancreas: Unremarkable. No pancreatic ductal dilatation or surrounding inflammatory changes. Spleen: Normal in size without focal abnormality. Adrenals/Urinary Tract: No adrenal mass. Prominent left extrarenal pelvis. Moderate right hydronephrosis, probably UPJ obstruction as before. Urinary bladder incompletely distended. Stomach/Bowel: Stomach is partially distended, unremarkable. Small bowel is nondistended, with good distal passage of oral contrast material. Changes of right hemicolectomy. Residual colon is  nondilated with a few scattered distal descending and sigmoid diverticula; no adjacent inflammatory/edematous change. Vascular/Lymphatic: Moderate aortoiliac calcified atheromatous plaque without aneurysm or evident stenosis. Portal vein patent. No mesenteric, retroperitoneal or pelvic adenopathy. Reproductive: Status post hysterectomy. No adnexal masses. Other: Bilateral pelvic phleboliths.  No ascites.  No free air. Musculoskeletal: No acute or significant osseous findings. IMPRESSION: 1. No acute findings.  No mass or adenopathy. 2. Stable 3 mm right pulmonary nodules. 3. 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 4. Coronary and Aortic Atherosclerosis (ICD10-170.0). Electronically Signed   By: Lucrezia Europe M.D.   On: 08/10/2021 10:30      ASSESSMENT & PLAN:  1. Appendiceal tumor   2. Elevated CEA   3. Hydronephrosis of right kidney    #High-grade appendiceal mucinous neoplasm HAMN, pT4a pN0 M0 CT showed no metastatic or progression.  CEA elevated may be due to other etiology. Recommend to repeat CEA in 4 weeks.   Follow-up in 6 months after CT scan.  Right kidney hydronephrosis, was seen by urology. Normal lasix renal scan, she will repeat renal US ordered by urology in 1 year.   Orders Placed This Encounter  Procedures   CEA    Standing Status:   Future    Standing Expiration Date:   08/13/2022    All questions were answered. The patient knows to call the clinic with any problems questions or concerns.   Rusty Aus, MD    Return of visit:  Keep currently scheduled CT, Lab and MD.   Earlie Server, MD, PhD [facility] Hematology Oncology 08/13/2021

## 2021-09-10 ENCOUNTER — Other Ambulatory Visit: Payer: BC Managed Care – PPO

## 2021-09-13 ENCOUNTER — Other Ambulatory Visit: Payer: Self-pay

## 2021-09-13 ENCOUNTER — Inpatient Hospital Stay: Payer: BC Managed Care – PPO | Attending: Oncology

## 2021-09-13 DIAGNOSIS — D373 Neoplasm of uncertain behavior of appendix: Secondary | ICD-10-CM | POA: Insufficient documentation

## 2021-09-13 DIAGNOSIS — R970 Elevated carcinoembryonic antigen [CEA]: Secondary | ICD-10-CM | POA: Insufficient documentation

## 2021-09-14 LAB — CEA: CEA: 6.6 ng/mL — ABNORMAL HIGH (ref 0.0–4.7)

## 2021-11-29 ENCOUNTER — Other Ambulatory Visit: Payer: Self-pay

## 2021-11-29 ENCOUNTER — Inpatient Hospital Stay: Payer: BC Managed Care – PPO | Attending: Oncology

## 2021-11-29 ENCOUNTER — Ambulatory Visit
Admission: RE | Admit: 2021-11-29 | Discharge: 2021-11-29 | Disposition: A | Discharge status: Home / Self Care | Payer: BC Managed Care – PPO | Source: Ambulatory Visit | Attending: Oncology | Admitting: Oncology

## 2021-11-29 DIAGNOSIS — R970 Elevated carcinoembryonic antigen [CEA]: Secondary | ICD-10-CM | POA: Insufficient documentation

## 2021-11-29 DIAGNOSIS — D373 Neoplasm of uncertain behavior of appendix: Secondary | ICD-10-CM

## 2021-11-29 LAB — CBC WITH DIFFERENTIAL/PLATELET
Abs Immature Granulocytes: 0.02 10*3/uL (ref 0.00–0.07)
Basophils Absolute: 0 10*3/uL (ref 0.0–0.1)
Basophils Relative: 0 %
Eosinophils Absolute: 0.1 10*3/uL (ref 0.0–0.5)
Eosinophils Relative: 1 %
HCT: 46.1 % — ABNORMAL HIGH (ref 36.0–46.0)
Hemoglobin: 14.8 g/dL (ref 12.0–15.0)
Immature Granulocytes: 0 %
Lymphocytes Relative: 24 %
Lymphs Abs: 1.7 10*3/uL (ref 0.7–4.0)
MCH: 29.6 pg (ref 26.0–34.0)
MCHC: 32.1 g/dL (ref 30.0–36.0)
MCV: 92.2 fL (ref 80.0–100.0)
Monocytes Absolute: 0.6 10*3/uL (ref 0.1–1.0)
Monocytes Relative: 8 %
Neutro Abs: 4.6 10*3/uL (ref 1.7–7.7)
Neutrophils Relative %: 67 %
Platelets: 207 10*3/uL (ref 150–400)
RBC: 5 MIL/uL (ref 3.87–5.11)
RDW: 12.6 % (ref 11.5–15.5)
WBC: 7 10*3/uL (ref 4.0–10.5)
nRBC: 0 % (ref 0.0–0.2)

## 2021-11-29 LAB — COMPREHENSIVE METABOLIC PANEL
ALT: 21 U/L (ref 0–44)
AST: 20 U/L (ref 15–41)
Albumin: 4.2 g/dL (ref 3.5–5.0)
Alkaline Phosphatase: 43 U/L (ref 38–126)
Anion gap: 6 (ref 5–15)
BUN: 31 mg/dL — ABNORMAL HIGH (ref 8–23)
CO2: 28 mmol/L (ref 22–32)
Calcium: 8.8 mg/dL — ABNORMAL LOW (ref 8.9–10.3)
Chloride: 104 mmol/L (ref 98–111)
Creatinine, Ser: 0.66 mg/dL (ref 0.44–1.00)
GFR, Estimated: >60 mL/min (ref >60)
Glucose, Bld: 99 mg/dL (ref 70–99)
Potassium: 4.1 mmol/L (ref 3.5–5.1)
Sodium: 138 mmol/L (ref 135–145)
Total Bilirubin: 0.9 mg/dL (ref 0.3–1.2)
Total Protein: 7.4 g/dL (ref 6.5–8.1)

## 2021-11-29 MED ORDER — IOHEXOL 300 MG/ML  SOLN
100.0000 mL | Freq: Once | INTRAMUSCULAR | Status: AC | PRN
  Administered 2021-11-29: 100 mL via INTRAVENOUS

## 2021-11-30 ENCOUNTER — Encounter: Payer: Self-pay | Admitting: Oncology

## 2021-11-30 LAB — CEA: CEA: 6.8 ng/mL — ABNORMAL HIGH (ref 0.0–4.7)

## 2021-12-01 ENCOUNTER — Ambulatory Visit: Payer: BC Managed Care – PPO | Admitting: Oncology

## 2021-12-01 ENCOUNTER — Telehealth: Payer: Self-pay

## 2021-12-01 ENCOUNTER — Encounter: Payer: Self-pay | Admitting: Oncology

## 2021-12-01 ENCOUNTER — Inpatient Hospital Stay (HOSPITAL_BASED_OUTPATIENT_CLINIC_OR_DEPARTMENT_OTHER): Payer: BC Managed Care – PPO | Admitting: Oncology

## 2021-12-01 DIAGNOSIS — R970 Elevated carcinoembryonic antigen [CEA]: Secondary | ICD-10-CM | POA: Diagnosis not present

## 2021-12-01 DIAGNOSIS — Z90710 Acquired absence of both cervix and uterus: Secondary | ICD-10-CM

## 2021-12-01 DIAGNOSIS — D373 Neoplasm of uncertain behavior of appendix: Secondary | ICD-10-CM

## 2021-12-01 DIAGNOSIS — I10 Essential (primary) hypertension: Secondary | ICD-10-CM | POA: Diagnosis not present

## 2021-12-01 NOTE — Telephone Encounter (Signed)
Per secure chat from Abby:   "Pt called and said she forgot to ask you about the diverticulitis on her CT and if there is anything she should avoid doing."   Called patient to follow up with question in regards to Diverticulosis. Patient asking if this is something she needs to worried about or what she can do. Per Dr. Tasia Catchings, there is no concern at this time. She recommends patient to incorporate a high fiber diet. Patient verbalized understanding.

## 2021-12-01 NOTE — Progress Notes (Signed)
HEMATOLOGY-ONCOLOGY TeleHEALTH VISIT PROGRESS NOTE  I connected with Barbara Johnston on 12/01/21  at  2:45 PM EDT by video enabled telemedicine visit and verified that I am speaking with the correct person using two identifiers. I discussed the limitations, risks, security and privacy concerns of performing an evaluation and management service by telemedicine and the availability of in-person appointments. The patient expressed understanding and agreed to proceed.   Other persons participating in the visit and their role in the encounter:  None  Patient's location: Home  Provider's location: office Chief Complaint: History of [place] Barbara Johnston is a 61 y.o. female who has above history reviewed by me today presents for follow up visit for [facility] Patient reports feeling well.  Appetite is good.  No unintentional weight loss, night sweat, abdominal pain, blood in the stool.  Review of Systems  Constitutional:  Negative for appetite change, chills, fatigue and fever.  HENT:   Negative for hearing loss and voice change.   Eyes:  Negative for eye problems.  Respiratory:  Negative for chest tightness and cough.   Cardiovascular:  Negative for chest pain.  Gastrointestinal:  Negative for abdominal distention, abdominal pain and blood in stool.  Endocrine: Negative for hot flashes.  Genitourinary:  Negative for difficulty urinating and frequency.   Musculoskeletal:  Negative for arthralgias.  Skin:  Negative for itching and rash.  Neurological:  Negative for extremity weakness.  Hematological:  Negative for adenopathy.  Psychiatric/Behavioral:  Negative for confusion.     Past Medical History:  Diagnosis Date   Aortic atherosclerosis ([place])    Ascending aorta dilation ([place]) 08/04/2008   a.) Cardiac CT 08/04/2008: ascending aorta above the sinotubular junction measured 3.7 cm; descending aorta measured 2.0 cm.   Atrial fibrillation ([place])    a.) CHA2DS2-VASc = 3 (sex,  HTN, aortic plaque). b.) rate/ryhthm maintained on metoprolol; chronically anticoagulated with reduced dose apixaban. c.) Ablation 05/08/2008. d.) DCCV 01/21/2020. e.) Repeat cardiac ablation 02/26/2020.   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   Hyperlipidemia    Hypertension    Motion sickness    Obesity    PONV (postoperative nausea and vomiting)    Past Surgical History:  Procedure Laterality Date   ABDOMINAL HYSTERECTOMY     CARDIAC ELECTROPHYSIOLOGY MAPPING AND ABLATION N/A 05/08/2008   Procedure: CARDIAC ABLATION WITH PVI x 4; Location: [place] AND ABLATION N/A 02/26/2020   Procedure: CARDIAC ABLATION; Location: [facility]   CARDIOVERSION N/A 01/21/2020   Procedure: CARDIOVERSION;  Surgeon: Corey Skains, MD;  Location: [facility] OR[facility];  Service: Cardiovascular;  Laterality: N/A;   CHOLECYSTECTOMY     COLONOSCOPY WITH PROPOFOL N/A 03/01/2021   Procedure: COLONOSCOPY WITH PROPOFOL;  Surgeon: Annamaria Helling, DO;  Location: [place];  Service: Gastroenterology;  Laterality: N/A;   OOPHORECTOMY     XI ROBOTIC LAPAROSCOPIC ASSISTED APPENDECTOMY  04/21/2021   Procedure: XI ROBOT ASSISTED LAPAROSCOPIC APPENDECTOMY;  Surgeon: Herbert Pun, MD;  Location: [facility] ORS;  Service: General;;    Family History  Problem Relation Age of Onset   Heart disease Father    Heart disease Brother    Breast cancer Neg Hx     Social History   Socioeconomic History   Marital status: Married    Spouse name: Not on file  Number of children: Not on file   Years of education: Not on file   Highest education level: Not on file  Occupational History    Employer: [employer]  Tobacco Use   Smoking status: Never   Smokeless tobacco: Never  Vaping Use   Vaping Use: Never used  Substance and  Sexual Activity   Alcohol use: Not Currently   Drug use: Never   Sexual activity: Not on file  Other Topics Concern   Not on file  Social History Narrative   Not on file   Social Determinants of Health   Financial Resource Strain: Not on file  Food Insecurity: Not on file  Transportation Needs: Not on file  Physical Activity: Not on file  Stress: Not on file  Social Connections: Not on file  Intimate Partner Violence: Not on file    Current Outpatient Medications on File Prior to Visit  Medication Sig Dispense Refill   acetaminophen (TYLENOL) 500 MG tablet Take 500 mg by mouth every 6 (six) hours as needed for moderate pain or headache.     apixaban (ELIQUIS) 2.5 MG TABS tablet Take 2.5 mg by mouth 2 (two) times daily.     atorvastatin (LIPITOR) 20 MG tablet Take 20 mg by mouth daily.      Calcium Carb-Cholecalciferol (CALCIUM 600 + D PO) Take 1 tablet by mouth daily.     docusate sodium (COLACE) 100 MG capsule Take 100 mg by mouth daily.     furosemide (LASIX) 20 MG tablet Take 20 mg by mouth daily.      L-Lysine HCl 500 MG TABS Take 500 mg by mouth daily.      magnesium oxide (MAG-OX) 400 MG tablet Take 800 mg by mouth daily.      Multiple Vitamin (MULTI-VITAMINS) TABS Take 1 tablet by mouth daily.      omeprazole (PRILOSEC) 20 MG capsule Take 20 mg by mouth daily.      potassium chloride (K-DUR) 10 MEQ tablet Take 10 mEq by mouth daily.      Probiotic Product (PROBIOTIC DAILY PO) Take 1 capsule by mouth daily.     vitamin E 400 UNIT capsule Take 400 Units by mouth daily.      metoprolol succinate (TOPROL-XL) 50 MG 24 hr tablet Take 1 tablet (50 mg total) by mouth daily. Take with or immediately following a meal. (Patient not taking: Reported on 08/13/2021) 90 tablet 4   No current facility-administered medications on file prior to visit.    Allergies  Allergen Reactions   Codeine Other (See Comments)    Hallucinations        Observations/Objective: Today's Vitals    12/01/21 1449  PainSc: 0-No pain   There is no height or weight on file to calculate BMI.  Physical Exam Neurological:     Mental Status: She is alert.     CBC    Component Value Date/Time   WBC 7.0 11/29/2021 0907   RBC 5.00 11/29/2021 0907   HGB 14.8 11/29/2021 0907   HCT 46.1 (H) 11/29/2021 0907   PLT 207 11/29/2021 0907   MCV 92.2 11/29/2021 0907   MCH 29.6 11/29/2021 0907   MCHC 32.1 11/29/2021 0907   RDW 12.6 11/29/2021 0907   LYMPHSABS 1.7 11/29/2021 0907   MONOABS 0.6 11/29/2021 0907   EOSABS 0.1 11/29/2021 0907   BASOSABS 0.0 11/29/2021 0907    CMP     Component Value Date/Time   NA 138 11/29/2021 0907   K  4.1 11/29/2021 0907   CL 104 11/29/2021 0907   CO2 28 11/29/2021 0907   GLUCOSE 99 11/29/2021 0907   BUN 31 (H) 11/29/2021 0907   CREATININE 0.66 11/29/2021 0907   CALCIUM 8.8 (L) 11/29/2021 0907   PROT 7.4 11/29/2021 0907   ALBUMIN 4.2 11/29/2021 0907   AST 20 11/29/2021 0907   ALT 21 11/29/2021 0907   ALKPHOS 43 11/29/2021 0907   BILITOT 0.9 11/29/2021 0907   GFRNONAA >60 11/29/2021 0907   GFRAA >60 01/21/2020 0942     Assessment and Plan: 1. Appendiceal tumor   2. Elevated CEA     #High-grade appendiceal mucinous neoplasm HAMN, pT4a pN0 M0 CT showed no metastatic or progression.  Recommend to repeat colonoscopy 1 year after surgery November 2023  #CEA is chronically elevated.  Unknown etiology.   Chronic diverticulosis with no evidence of diverticulitis.  Observation.  Follow Up Instructions: Repeat CT imaging 6 months.  Follow-up after CT.   I discussed the assessment and treatment plan with the patient. The patient was provided an opportunity to ask questions and all were answered. The patient agreed with the plan and demonstrated an understanding of the instructions.  The patient was advised to call back or seek an in-person evaluation if the symptoms worsen or if the condition fails to improve as anticipated.   I provided 15 minutes  of face-to-face video visit time during this encounter, and > 50% was spent counseling as documented under my assessment & plan.  Earlie Server, MD 12/01/2021 11:18 PM

## 2022-04-25 ENCOUNTER — Encounter: Payer: Self-pay | Admitting: Oncology

## 2022-04-26 ENCOUNTER — Other Ambulatory Visit: Payer: Self-pay | Admitting: Internal Medicine

## 2022-04-26 DIAGNOSIS — Z1231 Encounter for screening mammogram for malignant neoplasm of breast: Secondary | ICD-10-CM

## 2022-06-01 IMAGING — CT CT CHEST-ABD-PELV W/ CM
2 of 5 series · 9 of 36 positions shown, 15 images · IV contrast (agent unspecified)
Comparison: 05/11/2021 and previous

CLINICAL DATA: Elevated CEA, appendiceal tumor post appendectomy
and partial colectomy.

EXAM:
CT CHEST, ABDOMEN, AND PELVIS WITH CONTRAST
TECHNIQUE: Multidetector CT imaging of the chest, abdomen and pelvis was
performed following the standard protocol during bolus
administration of intravenous contrast.

[Series 2: axials cap 5.00 · axial · 0.81mm/px · z∈[-1425,-970]mm · 6 of 129 slices shown, 11 images]
[im 19/129  mediastinal]
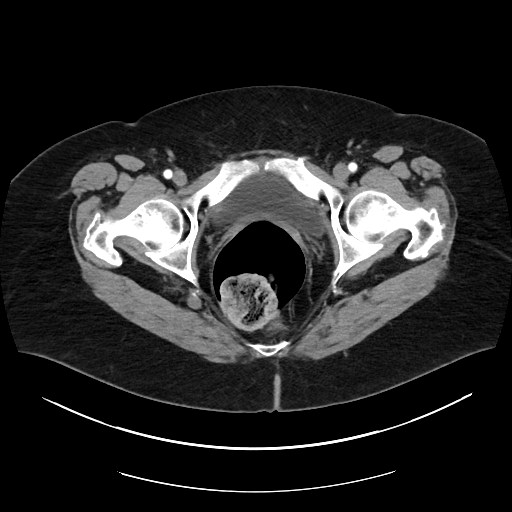
[im 19/129  bone]
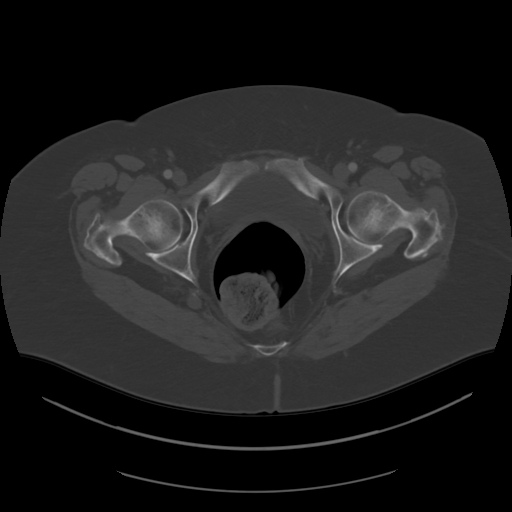
[im 37/129  mediastinal]
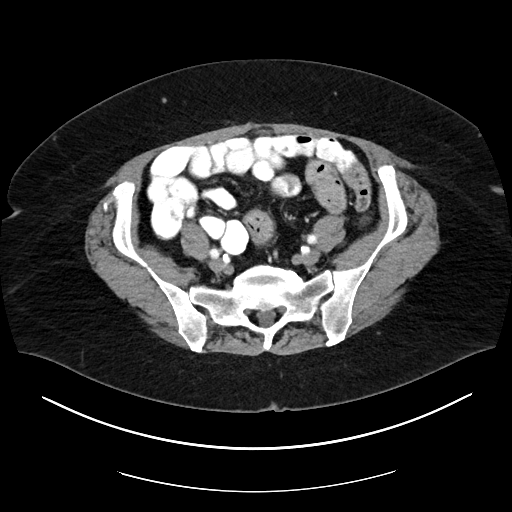
[im 55/129  mediastinal]
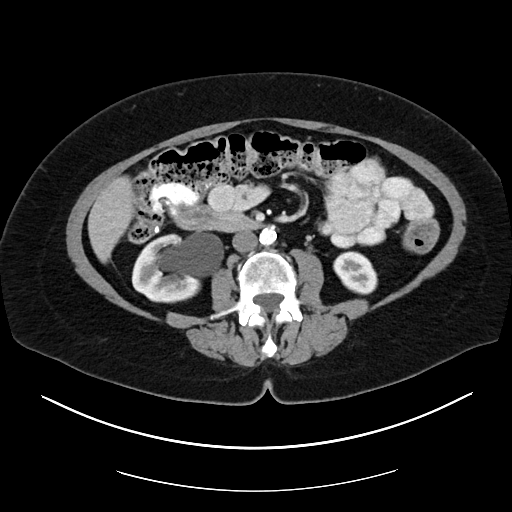
[im 55/129  lung]
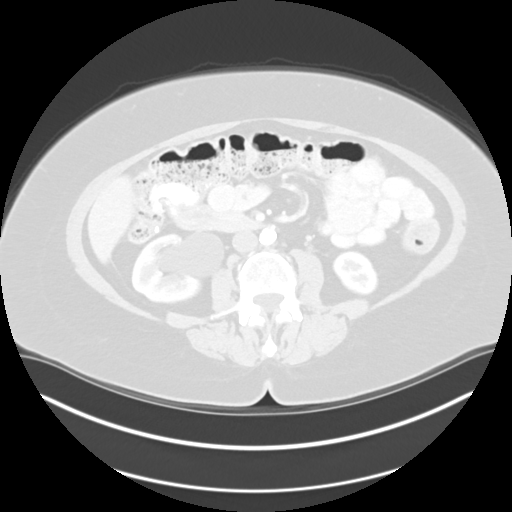
[im 74/129  mediastinal]
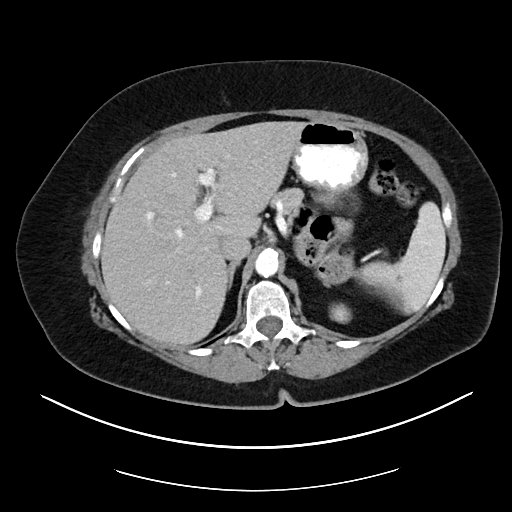
[im 74/129  lung]
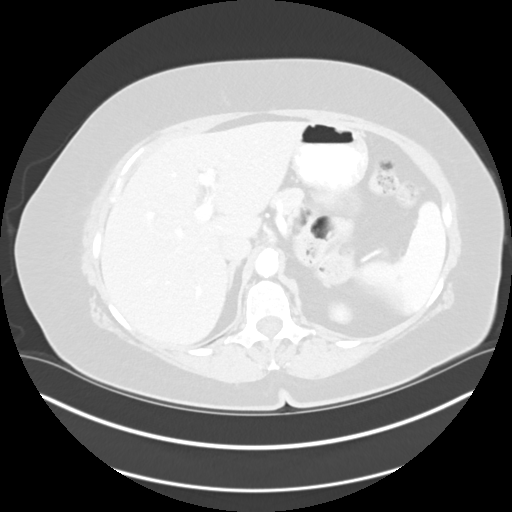
[im 92/129  mediastinal]
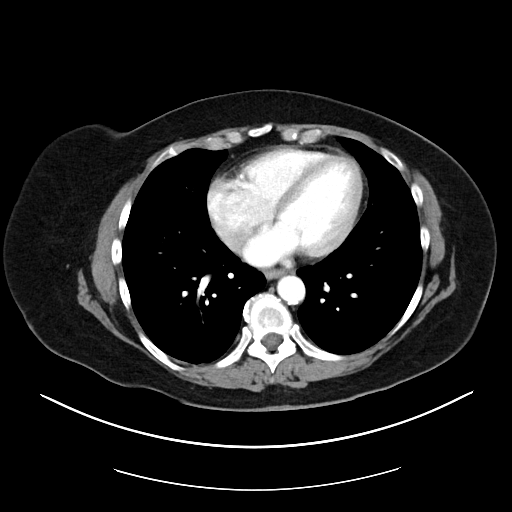
[im 92/129  lung]
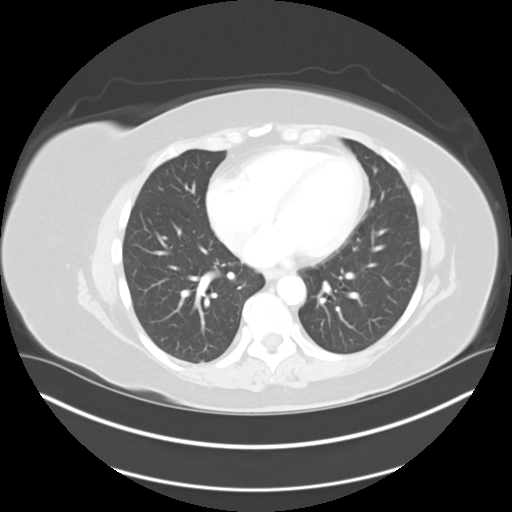
[im 110/129  mediastinal]
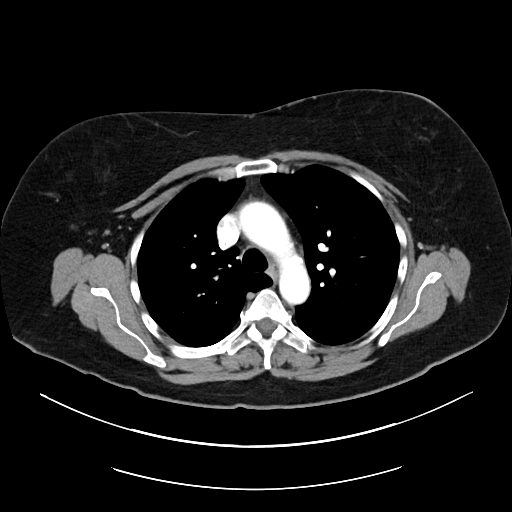
[im 110/129  lung]
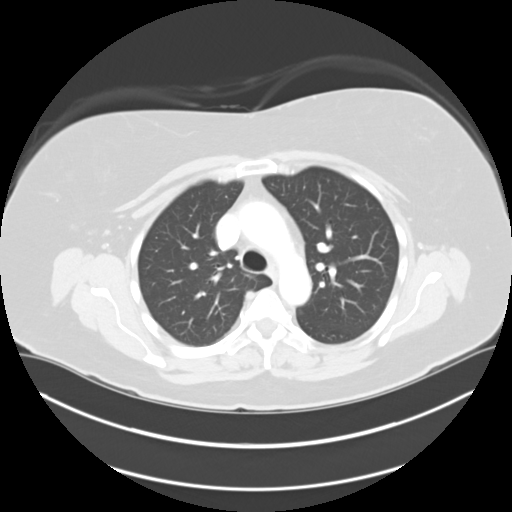

[Series 4: coronals cap 2.00 cor · coronal · 0.81mm/px · 3 of 145 slices shown, 4 images]
[im 29/145  mediastinal]
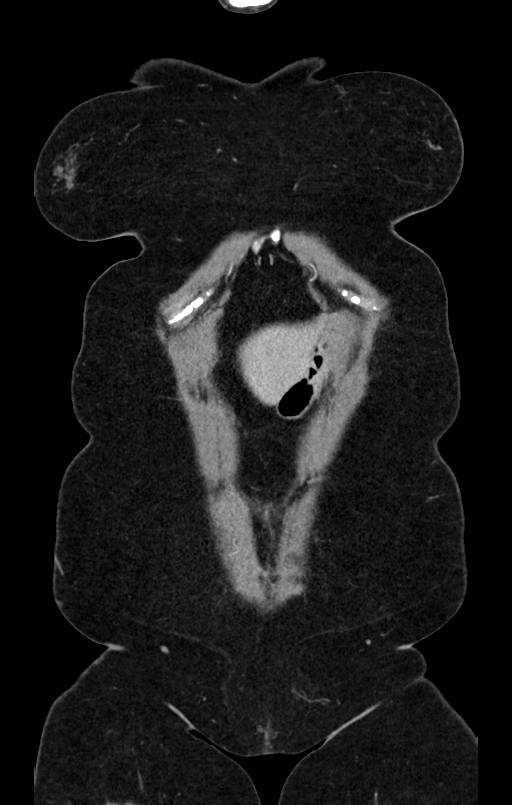
[im 58/145  mediastinal]
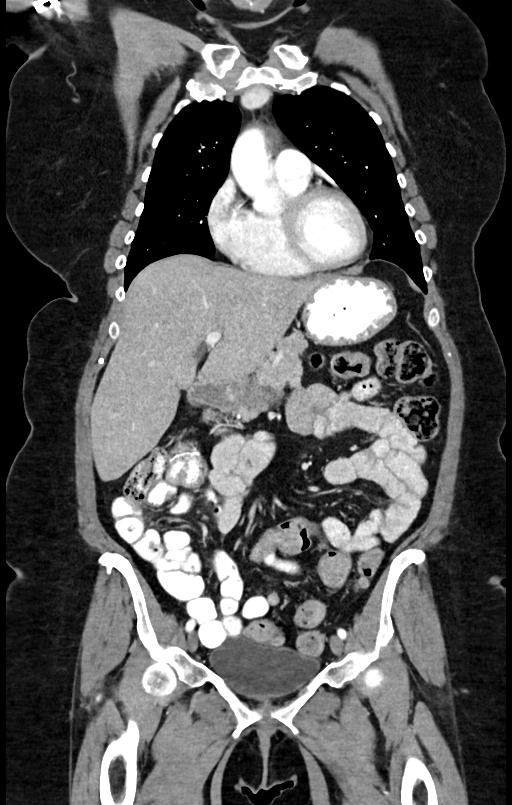
[im 58/145  bone]
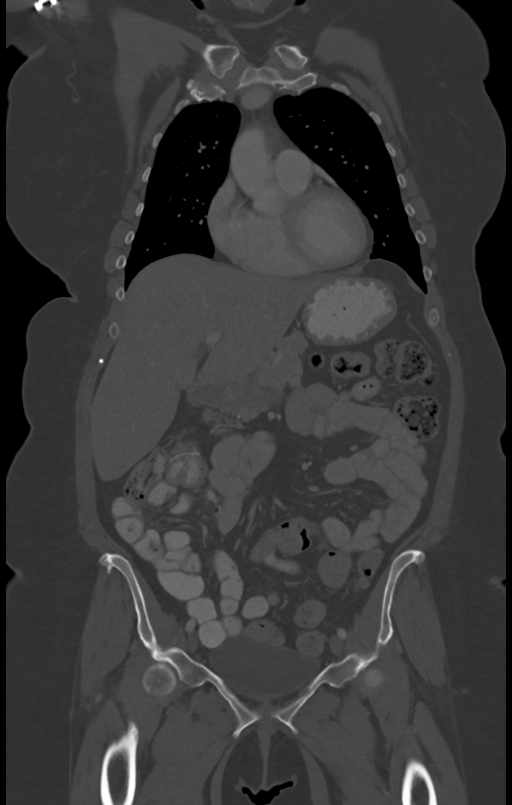
[im 87/145  mediastinal]
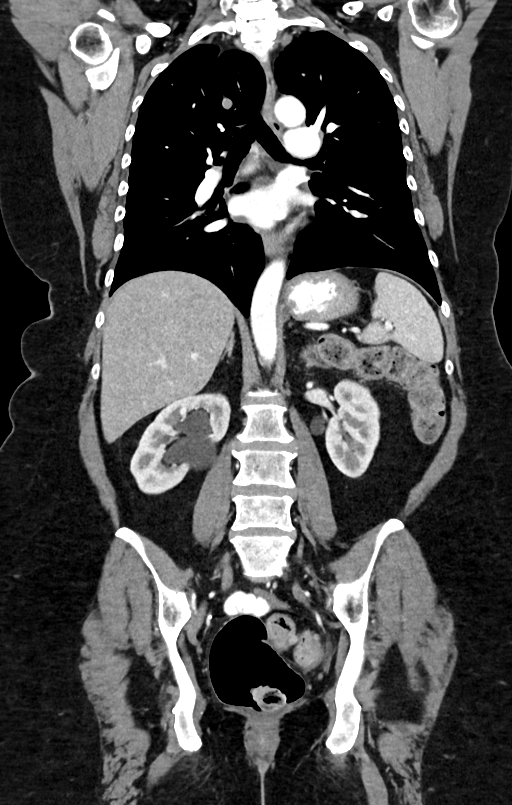

[9 of 36 positions shown; findings below may reference images not displayed]

RADIATION DOSE REDUCTION: This exam was performed according to the
departmental dose-optimization program which includes automated
exposure control, adjustment of the mA and/or kV according to
patient size and/or use of iterative reconstruction technique.

CONTRAST:  100mL OMNIPAQUE IOHEXOL 300 MG/ML  SOLN
FINDINGS: CT CHEST FINDINGS

Cardiovascular: Heart size normal. No pericardial effusion.
Scattered coronary calcifications. Scattered aortic atheromatous
calcified plaque.

Mediastinum/Nodes: No mass or adenopathy.

Lungs/Pleura: No pleural effusion. Azygos fissure, an anatomic
variant. 3 mm subpleural nodule, right lower lobe (Im82,Se3) ,
stable since 05/11/2021. [DATE] mm subpleural nodule, right middle lobe
(Im92,Se3) , stable. No new nodule or infiltrate.

Musculoskeletal: Anterior vertebral endplate spurring at multiple
levels in the lower thoracic spine.

CT ABDOMEN PELVIS FINDINGS

Hepatobiliary: No focal liver abnormality is seen. Status post
cholecystectomy. No biliary dilatation.

Pancreas: Unremarkable. No pancreatic ductal dilatation or
surrounding inflammatory changes.

Spleen: Normal in size without focal abnormality.

Adrenals/Urinary Tract: No adrenal mass. Prominent left extrarenal
pelvis. Moderate right hydronephrosis, probably UPJ obstruction as
before. Urinary bladder incompletely distended.

Stomach/Bowel: Stomach is partially distended, unremarkable. Small
bowel is nondistended, with good distal passage of oral contrast
material. Changes of right hemicolectomy. Residual colon is
nondilated with a few scattered distal descending and sigmoid
diverticula; no adjacent inflammatory/edematous change.

Vascular/Lymphatic: Moderate aortoiliac calcified atheromatous
plaque without aneurysm or evident stenosis. Portal vein patent. No
mesenteric, retroperitoneal or pelvic adenopathy.

Reproductive: Status post hysterectomy. No adnexal masses.

Other: Bilateral pelvic phleboliths.  No ascites.  No free air.

Musculoskeletal: No acute or significant osseous findings.
IMPRESSION: 1. No acute findings.  No mass or adenopathy.
2. Stable 3 mm right pulmonary nodules.
3. Persistent right hydronephrosis, probable UPJ obstruction.
4. Coronary and Aortic Atherosclerosis (TL9L8-170.0).

## 2022-06-02 ENCOUNTER — Other Ambulatory Visit: Payer: BC Managed Care – PPO

## 2022-06-03 ENCOUNTER — Ambulatory Visit
Admission: RE | Admit: 2022-06-03 | Discharge: 2022-06-03 | Disposition: A | Discharge status: Home / Self Care | Payer: BC Managed Care – PPO | Source: Ambulatory Visit | Attending: Oncology | Admitting: Oncology

## 2022-06-03 ENCOUNTER — Inpatient Hospital Stay: Payer: BC Managed Care – PPO | Attending: Oncology

## 2022-06-03 ENCOUNTER — Inpatient Hospital Stay: Payer: BC Managed Care – PPO

## 2022-06-03 DIAGNOSIS — D373 Neoplasm of uncertain behavior of appendix: Secondary | ICD-10-CM | POA: Insufficient documentation

## 2022-06-03 DIAGNOSIS — R918 Other nonspecific abnormal finding of lung field: Secondary | ICD-10-CM | POA: Insufficient documentation

## 2022-06-03 DIAGNOSIS — D751 Secondary polycythemia: Secondary | ICD-10-CM | POA: Insufficient documentation

## 2022-06-03 DIAGNOSIS — C181 Malignant neoplasm of appendix: Secondary | ICD-10-CM | POA: Diagnosis present

## 2022-06-03 DIAGNOSIS — I10 Essential (primary) hypertension: Secondary | ICD-10-CM | POA: Insufficient documentation

## 2022-06-03 DIAGNOSIS — N1330 Unspecified hydronephrosis: Secondary | ICD-10-CM | POA: Insufficient documentation

## 2022-06-03 DIAGNOSIS — Z90710 Acquired absence of both cervix and uterus: Secondary | ICD-10-CM | POA: Insufficient documentation

## 2022-06-03 LAB — COMPREHENSIVE METABOLIC PANEL
ALT: 22 U/L (ref 0–44)
AST: 22 U/L (ref 15–41)
Albumin: 4.4 g/dL (ref 3.5–5.0)
Alkaline Phosphatase: 55 U/L (ref 38–126)
Anion gap: 10 (ref 5–15)
BUN: 27 mg/dL — ABNORMAL HIGH (ref 8–23)
CO2: 28 mmol/L (ref 22–32)
Calcium: 9.1 mg/dL (ref 8.9–10.3)
Chloride: 99 mmol/L (ref 98–111)
Creatinine, Ser: 0.67 mg/dL (ref 0.44–1.00)
GFR, Estimated: >60 mL/min (ref >60)
Glucose, Bld: 102 mg/dL — ABNORMAL HIGH (ref 70–99)
Potassium: 4 mmol/L (ref 3.5–5.1)
Sodium: 137 mmol/L (ref 135–145)
Total Bilirubin: 1 mg/dL (ref 0.3–1.2)
Total Protein: 7.9 g/dL (ref 6.5–8.1)

## 2022-06-03 LAB — CBC WITH DIFFERENTIAL/PLATELET
Abs Immature Granulocytes: 0.03 10*3/uL (ref 0.00–0.07)
Basophils Absolute: 0 10*3/uL (ref 0.0–0.1)
Basophils Relative: 0 %
Eosinophils Absolute: 0.1 10*3/uL (ref 0.0–0.5)
Eosinophils Relative: 2 %
HCT: 48.6 % — ABNORMAL HIGH (ref 36.0–46.0)
Hemoglobin: 15.9 g/dL — ABNORMAL HIGH (ref 12.0–15.0)
Immature Granulocytes: 0 %
Lymphocytes Relative: 17 %
Lymphs Abs: 1.4 10*3/uL (ref 0.7–4.0)
MCH: 29.5 pg (ref 26.0–34.0)
MCHC: 32.7 g/dL (ref 30.0–36.0)
MCV: 90.2 fL (ref 80.0–100.0)
Monocytes Absolute: 0.5 10*3/uL (ref 0.1–1.0)
Monocytes Relative: 6 %
Neutro Abs: 6.3 10*3/uL (ref 1.7–7.7)
Neutrophils Relative %: 75 %
Platelets: 222 10*3/uL (ref 150–400)
RBC: 5.39 MIL/uL — ABNORMAL HIGH (ref 3.87–5.11)
RDW: 12 % (ref 11.5–15.5)
WBC: 8.4 10*3/uL (ref 4.0–10.5)
nRBC: 0 % (ref 0.0–0.2)

## 2022-06-03 MED ORDER — IOHEXOL 300 MG/ML  SOLN
100.0000 mL | Freq: Once | INTRAMUSCULAR | Status: AC | PRN
  Administered 2022-06-03: 100 mL via INTRAVENOUS

## 2022-06-04 LAB — CEA: CEA: 4.7 ng/mL (ref 0.0–4.7)

## 2022-06-06 ENCOUNTER — Inpatient Hospital Stay (HOSPITAL_BASED_OUTPATIENT_CLINIC_OR_DEPARTMENT_OTHER): Payer: BC Managed Care – PPO | Admitting: Oncology

## 2022-06-06 ENCOUNTER — Encounter: Payer: Self-pay | Admitting: Oncology

## 2022-06-06 VITALS — BP 135/83 | HR 79 | Temp 97.2°F | Resp 18 | Wt 206.3 lb

## 2022-06-06 DIAGNOSIS — D751 Secondary polycythemia: Secondary | ICD-10-CM | POA: Diagnosis not present

## 2022-06-06 DIAGNOSIS — D373 Neoplasm of uncertain behavior of appendix: Secondary | ICD-10-CM | POA: Diagnosis not present

## 2022-06-06 DIAGNOSIS — N1330 Unspecified hydronephrosis: Secondary | ICD-10-CM | POA: Insufficient documentation

## 2022-06-06 DIAGNOSIS — C181 Malignant neoplasm of appendix: Secondary | ICD-10-CM | POA: Diagnosis not present

## 2022-06-06 NOTE — Progress Notes (Signed)
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  CEA elevated may be due to other etiology-normalized recently.  Continue monitor. Repeat CT scan in 6 months.  Hydronephrosis of right kidney Kidney function is stable. She was previously evaluated by urology.  She has normal Lasix renal scan. Continue follow-up with urology in January 2024 and there is plan to repeat renal ultrasound at that time.  Erythrocytosis Intermittent issue for patient. 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 Recommend patient to discuss with primary care provider and have sleep study done.  Orders Placed This Encounter  Procedures   CT CHEST ABDOMEN PELVIS W CONTRAST    Standing Status:   Future    Standing Expiration Date:   06/07/2023    Order Specific Question:   If indicated for the ordered procedure, I authorize the administration of contrast media per Radiology protocol    Answer:   Yes    Order Specific Question:   Does the patient have a contrast media/X-ray dye allergy?    Answer:   No    Order Specific Question:   Preferred imaging location?    Answer:   [facility]    Order Specific Question:   Is Oral Contrast requested for this exam?    Answer:   Yes, Per Radiology protocol   CBC with Differential/Platelet    Standing Status:   Future    Standing Expiration Date:   06/06/2023   Comprehensive metabolic panel    Standing Status:   Future    Standing Expiration Date:   06/06/2023   CEA    Standing Status:   Future    Standing Expiration Date:   06/06/2023   Follow-up in 6 months. All questions were answered. The patient knows to call the clinic with any problems, questions or concerns.  Earlie Server, MD, PhD [facility]  Hematology Oncology 06/06/2022   CHIEF COMPLAINTS/REASON FOR VISIT:  Follow up of  HAMN  HISTORY OF PRESENTING ILLNESS:   Barbara Johnston is a  61 y.o.  female with PMH listed below was seen in consultation at the request of  Rusty Aus, MD  for evaluation of [facility]  03/01/2021, colonoscopy showed Perianal skin tags found on perianal exam.- Two 2 to 3 mm polyps in the rectum and in the transverse colon, removed with a cold biopsy forceps. Resected and retrieved. - Non-bleeding internal hemorrhoids  04/02/2021, patient had CT abdomen pelvis with contrast performed for evaluation of abdominal bloating after colonoscopy CT scan showed severely enlarged appendix, without surrounding inflammation.  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  Mild to moderate right hydronephrosis was noted without obstructing calculus or ureteral dilatation.  Aortic atherosclerosis. 04/21/2021, patient underwent a craniectomy with partial cecectomy. 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  Grade 2, moderately differentiated, no lymphovascular invasion.  Acellular mucin invades visceral peritoneum.  Margins were negative.  Patient's case was presented by Dr. Peyton Najjar on tumor board.  Consensus recommendation was to proceed with right colectomy. 05/12/2021, CT chest with contrast showed no acute abnormality was noted.  Moderate coronary artery calcifications suggesting CAD.  Aortic atherosclerosis 05/19/2021, patient is status post robotic assisted laparoscopic right colectomy.  Patient was sent to establish care with oncology for discussion of post chemotherapy management/surveillance.  #  High-grade appendiceal mucinous neoplasm HAMN, pT4a pN0 M0 We discussed about the diagnosis, nature of the disease.  Because HAMN is a newly created pathological category, very limited data on the behavior of this condition, and this is a highly controversial area.  Although HAMN may appear  to have a more aggressive course then LAMNs, currently the clinical management is closer to that of LAMN, then that of mucinous adenocarcinomas. Patient does not have evidence of distant peritoneal spread.  CEA is chronically elevated.   08/10/21 CT chest abdomen pelvis w contrast showed No acute findings.  No mass or adenopathy. Stable 3 mm right pulmonary nodules.  Persistent right hydronephrosis  She was seen by urology and had a normal right lasix renal scan. indicating an absence of significant urinary outflow obstruction.   INTERVAL HISTORY Barbara Johnston is a 61 y.o. 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  Final pathology is not available to me in current EMR.  Per patient, margins were negative.  No additional treatments needed.  She has been doing well clinically.  He denies any unintentional weight loss, abdominal pain, night sweats or fever, change of bowel habits      Review of Systems  Constitutional:  Negative for appetite change, chills, fatigue and fever.  HENT:   Negative for hearing loss and voice change.   Eyes:  Negative for eye problems.  Respiratory:  Negative for chest tightness and cough.   Cardiovascular:  Negative for chest pain.  Gastrointestinal:  Negative for abdominal distention, abdominal pain and blood in stool.  Endocrine: Negative for hot flashes.  Genitourinary:  Negative for difficulty urinating and frequency.   Musculoskeletal:  Negative for arthralgias.  Skin:  Negative for itching and rash.  Neurological:  Negative for extremity weakness.  Hematological:  Negative for adenopathy.  Psychiatric/Behavioral:  Negative for confusion.     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   Atrial fibrillation ([place])    a.) CHA2DS2-VASc = 3 (sex, HTN, aortic plaque). b.) rate/ryhthm maintained on metoprolol; chronically anticoagulated with reduced dose apixaban. c.) Ablation 05/08/2008. d.) DCCV 01/21/2020. e.) Repeat cardiac ablation 02/26/2020.   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   Hyperlipidemia    Hypertension    Motion sickness    Obesity    PONV (postoperative nausea and vomiting)     SURGICAL HISTORY: Past Surgical History:  Procedure Laterality Date   ABDOMINAL HYSTERECTOMY     CARDIAC ELECTROPHYSIOLOGY MAPPING AND ABLATION N/A 05/08/2008   Procedure: CARDIAC ABLATION WITH PVI x 4; Location: [place] N/A 02/26/2020   Procedure: CARDIAC ABLATION; Location: [facility]   CARDIOVERSION N/A 01/21/2020   Procedure: CARDIOVERSION;  Surgeon: Corey Skains, MD;  Location: [facility] ORS;  Service: Cardiovascular;  Laterality: N/A;   CHOLECYSTECTOMY     COLONOSCOPY WITH PROPOFOL N/A 03/01/2021   Procedure: COLONOSCOPY WITH PROPOFOL;  Surgeon: Annamaria Helling, DO;  Location: [facility] ENDOSCOPY;  Service: Gastroenterology;  Laterality: N/A;  OOPHORECTOMY     XI ROBOTIC LAPAROSCOPIC ASSISTED APPENDECTOMY  04/21/2021   Procedure: XI ROBOT ASSISTED LAPAROSCOPIC APPENDECTOMY;  Surgeon: Herbert Pun, MD;  Location: [facility] ORS;  Service: General;;    SOCIAL HISTORY: Social History   Socioeconomic History   Marital status: Married    Spouse name: Not on file   Number of children: Not on file   Years of education: Not on file   Highest education level: Not on file  Occupational History    Employer: [employer]  Tobacco Use   Smoking status: Never   Smokeless  tobacco: Never  Vaping Use   Vaping Use: Never used  Substance and Sexual Activity   Alcohol use: Not Currently   Drug use: Never   Sexual activity: Not on file  Other Topics Concern   Not on file  Social History Narrative   Not on file   Social Determinants of Health   Financial Resource Strain: Not on file  Food Insecurity: Not on file  Transportation Needs: Not on file  Physical Activity: Not on file  Stress: Not on file  Social Connections: Not on file  Intimate Partner Violence: Not on file    FAMILY HISTORY: Family History  Problem Relation Age of Onset   Heart disease Father    Heart disease Brother    Breast cancer Neg Hx     ALLERGIES:  is allergic to codeine.  MEDICATIONS:  Current Outpatient Medications  Medication Sig Dispense Refill   acetaminophen (TYLENOL) 500 MG tablet Take 500 mg by mouth every 6 (six) hours as needed for moderate pain or headache.     apixaban (ELIQUIS) 2.5 MG TABS tablet Take 2.5 mg by mouth 2 (two) times daily.     atorvastatin (LIPITOR) 20 MG tablet Take 20 mg by mouth daily.      Calcium Carb-Cholecalciferol (CALCIUM 600 + D PO) Take 1 tablet by mouth daily.     docusate sodium (COLACE) 100 MG capsule Take 100 mg by mouth daily.     furosemide (LASIX) 20 MG tablet Take 20 mg by mouth daily.      L-Lysine HCl 500 MG TABS Take 500 mg by mouth daily.      magnesium oxide (MAG-OX) 400 MG tablet Take 800 mg by mouth daily.      Multiple Vitamin (MULTI-VITAMINS) TABS Take 1 tablet by mouth daily.      omeprazole (PRILOSEC) 20 MG capsule Take 20 mg by mouth daily.      potassium chloride (K-DUR) 10 MEQ tablet Take 10 mEq by mouth daily.      Probiotic Product (PROBIOTIC DAILY PO) Take 1 capsule by mouth daily.     vitamin E 400 UNIT capsule Take 400 Units by mouth daily.      metoprolol succinate (TOPROL-XL) 50 MG 24 hr tablet Take 1 tablet (50 mg total) by mouth daily. Take with or immediately following a meal. (Patient not taking:  Reported on 08/13/2021) 90 tablet 4   No current facility-administered medications for this visit.     PHYSICAL EXAMINATION: ECOG PERFORMANCE STATUS: 0 - Asymptomatic Vitals:   06/06/22 1047  BP: 135/83  Pulse: 79  Resp: 18  Temp: (!) 97.2 F (36.2 C)   Filed Weights   06/06/22 1047  Weight: 206 lb 4.8 oz (93.6 kg)    Physical Exam Constitutional:      General: She is not in acute distress. HENT:     Head: Normocephalic and atraumatic.  Eyes:  General: No scleral icterus. Cardiovascular:     Rate and Rhythm: Normal rate and regular rhythm.     Heart sounds: Normal heart sounds.  Pulmonary:     Effort: Pulmonary effort is normal. No respiratory distress.     Breath sounds: No wheezing.  Abdominal:     General: Bowel sounds are normal. There is no distension.     Palpations: Abdomen is soft.  Musculoskeletal:        General: No deformity. Normal range of motion.     Cervical back: Normal range of motion and neck supple.  Skin:    General: Skin is warm and dry.     Findings: No erythema or rash.  Neurological:     Mental Status: She is alert and oriented to person, place, and time. Mental status is at baseline.     Cranial Nerves: No cranial nerve deficit.     Coordination: Coordination normal.  Psychiatric:        Mood and Affect: Mood normal.     LABORATORY DATA:  I have reviewed the data as listed    Latest Ref Rng & Units 06/03/2022    9:25 AM 11/29/2021    9:07 AM 05/20/2021    3:49 AM  CBC  WBC 4.0 - 10.5 K/uL 8.4  7.0  12.0   Hemoglobin 12.0 - 15.0 g/dL 15.9  14.8  13.4   Hematocrit 36.0 - 46.0 % 48.6  46.1  39.9   Platelets 150 - 400 K/uL 222  207  181       Latest Ref Rng & Units 06/03/2022    9:25 AM 11/29/2021    9:07 AM 08/10/2021    9:33 AM  CMP  Glucose 70 - 99 mg/dL 102  99    BUN 8 - 23 mg/dL 27  31    Creatinine 0.44 - 1.00 mg/dL 0.67  0.66  0.80   Sodium 135 - 145 mmol/L 137  138    Potassium 3.5 - 5.1 mmol/L 4.0  4.1     Chloride 98 - 111 mmol/L 99  104    CO2 22 - 32 mmol/L 28  28    Calcium 8.9 - 10.3 mg/dL 9.1  8.8    Total Protein 6.5 - 8.1 g/dL 7.9  7.4    Total Bilirubin 0.3 - 1.2 mg/dL 1.0  0.9    Alkaline Phos 38 - 126 U/L 55  43    AST 15 - 41 U/L 22  20    ALT 0 - 44 U/L 22  21       RADIOGRAPHIC STUDIES: I have personally reviewed the radiological images as listed and agreed with the findings in the report. CT Abdomen Pelvis W Contrast  Result Date: 06/03/2022 CLINICAL DATA:  Follow-up high-grade appendiceal mucinous neoplasm. * Tracking Code: BO * EXAM: CT ABDOMEN AND PELVIS WITH CONTRAST TECHNIQUE: Multidetector CT imaging of the abdomen and pelvis was performed using the standard protocol following bolus administration of intravenous contrast. RADIATION DOSE REDUCTION: This exam was performed according to the departmental dose-optimization program which includes automated exposure control, adjustment of the mA and/or kV according to patient size and/or use of iterative reconstruction technique. CONTRAST:  198m OMNIPAQUE IOHEXOL 300 MG/ML  SOLN COMPARISON:  11/29/2021 FINDINGS: Lower Chest: No acute findings. Hepatobiliary:  No hepatic masses identified. Pancreas:  No mass or inflammatory changes. Spleen: Within normal limits in size and appearance. Adrenals/Urinary Tract: No suspicious masses identified. Stable moderate right renal pelvicaliectasis, without  evidence of delayed contrast excretion. No evidence of ureteral calculi or dilatation. Unremarkable unopacified urinary bladder. Stomach/Bowel: Stable postop changes from prior right colectomy. No soft tissue masses identified. No evidence of obstruction, inflammatory process or abnormal fluid collections. Diverticulosis is seen mainly involving the sigmoid colon, however there is no evidence of diverticulitis. Vascular/Lymphatic: No pathologically enlarged lymph nodes. No acute vascular findings. Aortic atherosclerotic calcification incidentally  noted. Reproductive: Prior hysterectomy noted. Adnexal regions are unremarkable in appearance. Other:  No evidence of peritoneal soft tissue densities or ascites. Musculoskeletal:  No suspicious bone lesions identified. IMPRESSION: Stable exam. No evidence of recurrent or metastatic disease within the abdomen or pelvis. Colonic diverticulosis, without radiographic evidence of diverticulitis. Aortic Atherosclerosis (ICD10-I70.0). Electronically Signed   By: Marlaine Hind M.D.   On: 06/03/2022 14:42

## 2022-06-06 NOTE — Assessment & Plan Note (Signed)
Intermittent issue for patient. 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 Recommend patient to discuss with primary care provider and have sleep study done.

## 2022-06-06 NOTE — Assessment & Plan Note (Addendum)
#  High-grade appendiceal mucinous neoplasm HAMN, pT4a pN0 M0 CT showed no metastatic or progression.  CEA elevated may be due to other etiology-normalized recently.  Continue monitor. Repeat CT scan in 6 months.

## 2022-06-06 NOTE — Progress Notes (Signed)
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

## 2022-06-06 NOTE — Assessment & Plan Note (Signed)
Kidney function is stable. She was previously evaluated by urology.  She has normal Lasix renal scan. Continue follow-up with urology in January 2024 and there is plan to repeat renal ultrasound at that time.

## 2022-06-22 ENCOUNTER — Ambulatory Visit: Payer: BC Managed Care – PPO | Admitting: Urology

## 2022-07-04 ENCOUNTER — Ambulatory Visit
Admission: RE | Admit: 2022-07-04 | Discharge: 2022-07-04 | Disposition: A | Discharge status: Home / Self Care | Payer: BC Managed Care – PPO | Source: Ambulatory Visit | Attending: Internal Medicine | Admitting: Internal Medicine

## 2022-07-04 DIAGNOSIS — Z1231 Encounter for screening mammogram for malignant neoplasm of breast: Secondary | ICD-10-CM | POA: Insufficient documentation

## 2022-09-20 IMAGING — CT CT ABD-PELV W/ CM
2 of 5 series · 15 of 46 positions shown, 17 images · IV contrast (agent unspecified)
Comparison: CT the chest, abdomen and pelvis 08/10/2021.

CLINICAL DATA: 60-year-old female with history of high-grade
appendiceal mucinous neoplasm. Follow-up study. * Tracking Code: BO
*

EXAM:
CT ABDOMEN AND PELVIS WITH CONTRAST
TECHNIQUE: Multidetector CT imaging of the abdomen and pelvis was performed
using the standard protocol following bolus administration of
intravenous contrast.

[Series 2: abd pelvis 5.00 · axial · 0.70mm/px · z∈[-1512,-1107]mm · 12 of 93 slices shown, 14 images]
[im 6/93  soft-tissue]
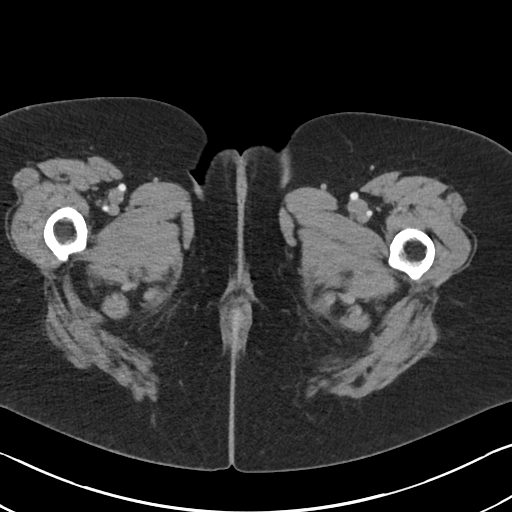
[im 6/93  bone]
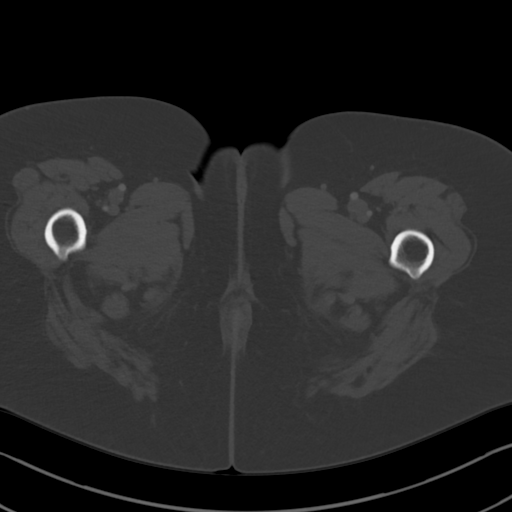
[im 17/93  soft-tissue]
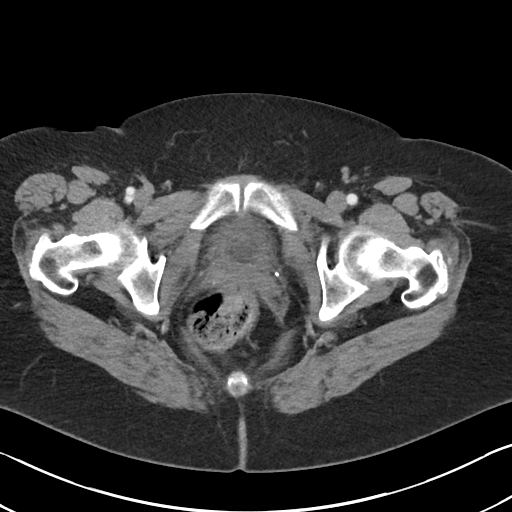
[im 22/93  soft-tissue]
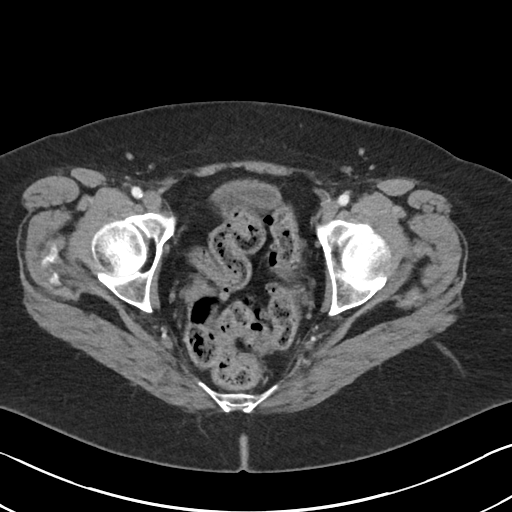
[im 28/93  soft-tissue]
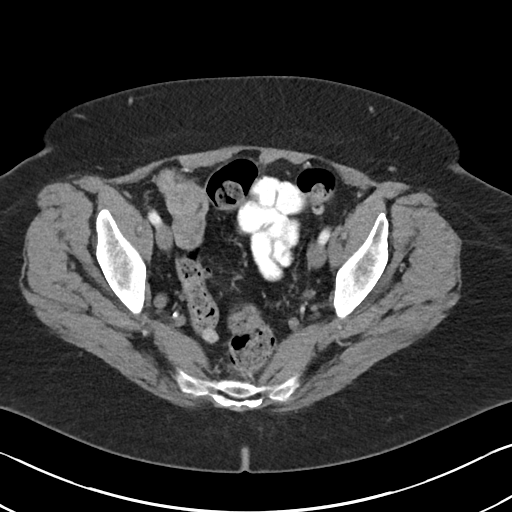
[im 38/93  soft-tissue]
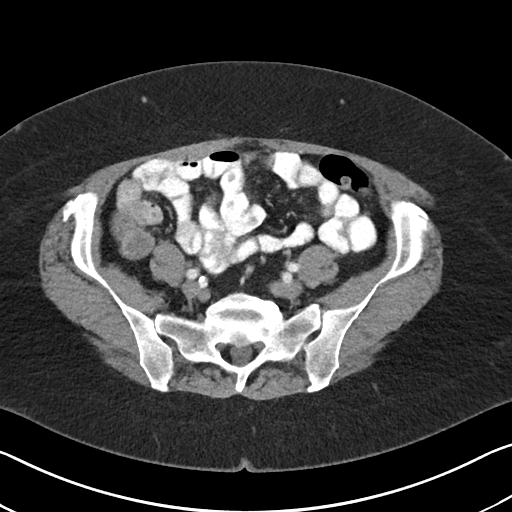
[im 44/93  soft-tissue]
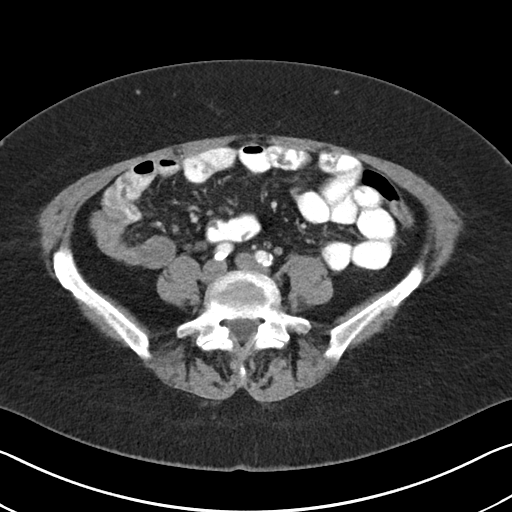
[im 49/93  soft-tissue]
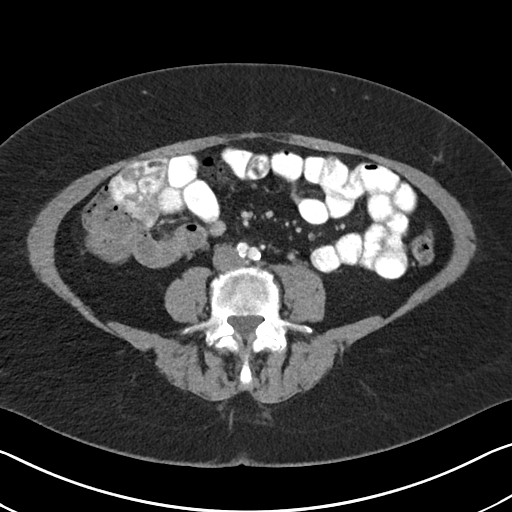
[im 60/93  soft-tissue]
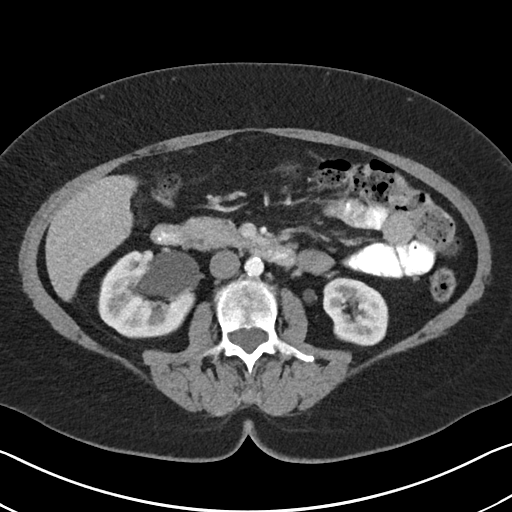
[im 65/93  soft-tissue]
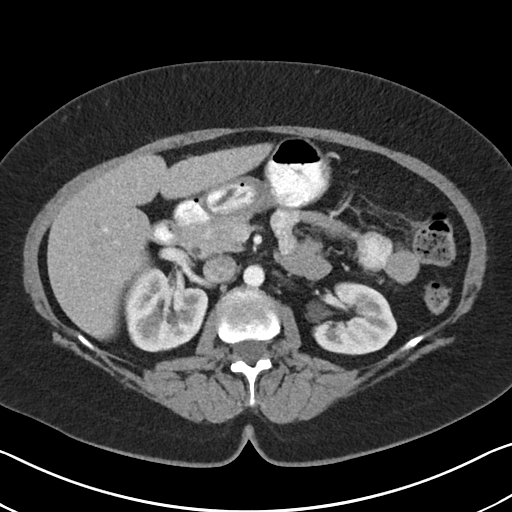
[im 65/93  bone]
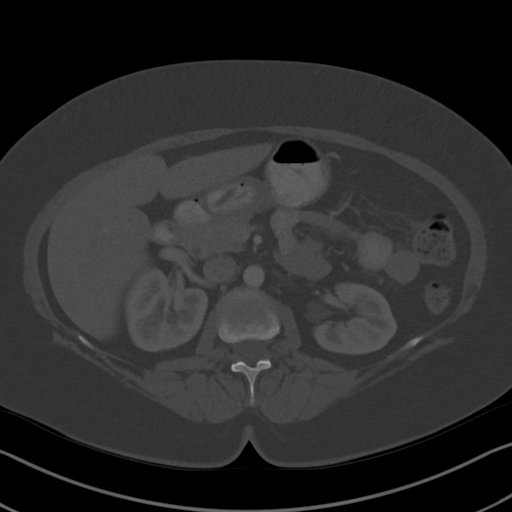
[im 71/93  soft-tissue]
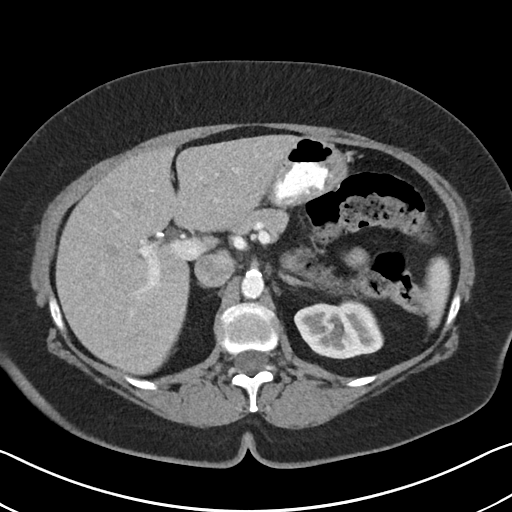
[im 82/93  soft-tissue]
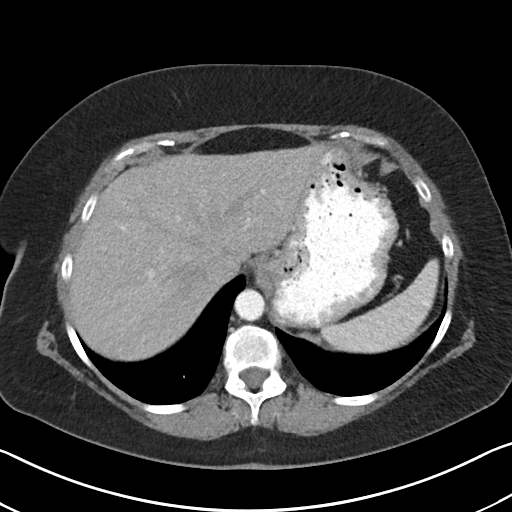
[im 87/93  soft-tissue]
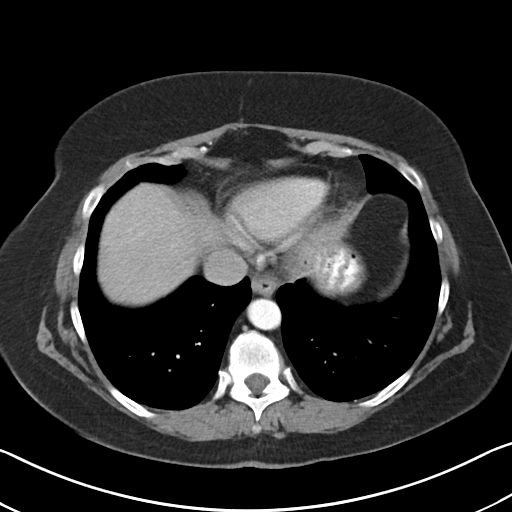

[Series 4: coronals abd pelvis 2.00 cor · coronal · 0.70mm/px · 3 of 146 slices shown]
[im 49/146  soft-tissue]
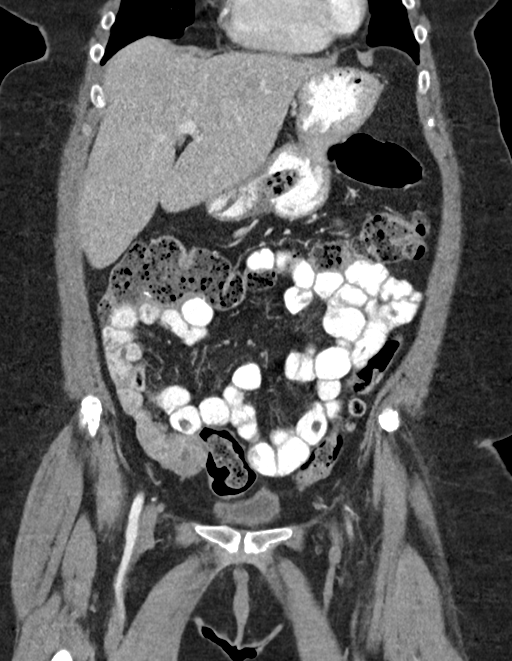
[im 65/146  soft-tissue]
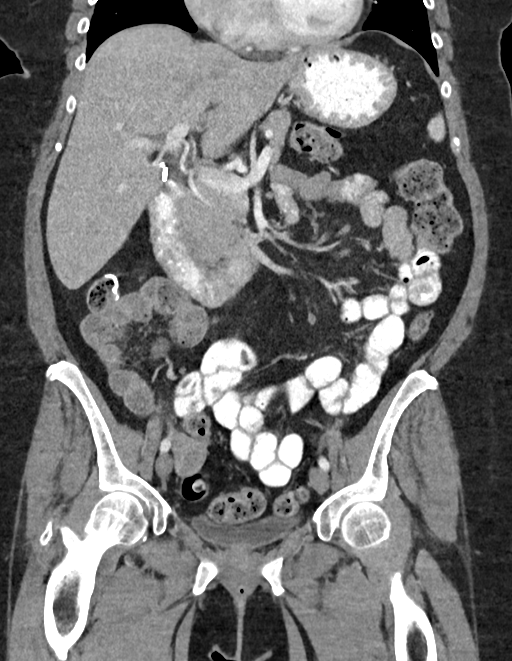
[im 81/146  soft-tissue]
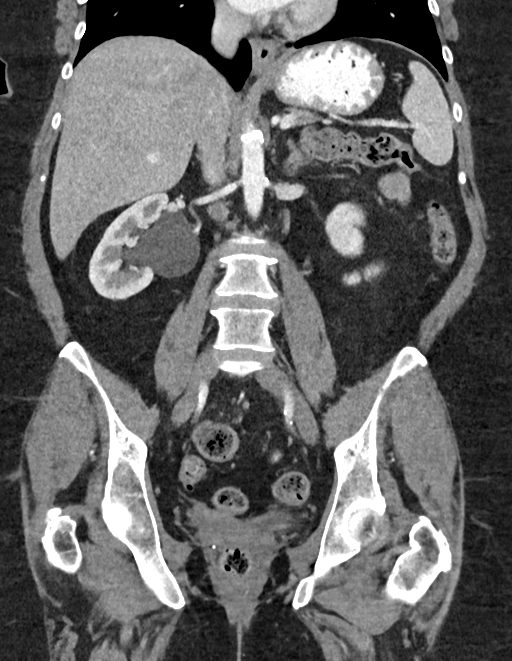

[15 of 46 positions shown; findings below may reference images not displayed]

RADIATION DOSE REDUCTION: This exam was performed according to the
departmental dose-optimization program which includes automated
exposure control, adjustment of the mA and/or kV according to
patient size and/or use of iterative reconstruction technique.

CONTRAST:  100mL OMNIPAQUE IOHEXOL 300 MG/ML  SOLN
FINDINGS: Lower chest: Unremarkable.

Hepatobiliary: No suspicious cystic or solid hepatic lesions. Status
post cholecystectomy. No intra or extrahepatic biliary ductal
dilatation.

Pancreas: No pancreatic mass. No pancreatic ductal dilatation. No
pancreatic or peripancreatic fluid collections or inflammatory
changes.

Spleen: Unremarkable.

Adrenals/Urinary Tract: Moderate right hydronephrosis terminating at
the level of the ureteropelvic junction, similar to the prior study
noted. No right hydroureter. No left hydroureteronephrosis. Left
kidney and bilateral adrenal glands are otherwise normal in
appearance. Urinary bladder is nearly decompressed, but otherwise
unremarkable in appearance.

Stomach/Bowel: The appearance of the stomach is normal. There is no
pathologic dilatation of small bowel or colon. Numerous colonic
diverticulae are noted, particularly in the sigmoid colon, without
surrounding inflammatory changes to suggest an acute diverticulitis
at this time. Postoperative changes of right hemicolectomy and
distal small bowel resection are noted with ileocolonic anastomosis.
No unexpected soft tissue mass noted in the region of the suture
lines to suggest locally recurrent disease.

Vascular/Lymphatic: Aortic atherosclerosis, without evidence of
aneurysm or dissection in the abdominal or pelvic vasculature. No
lymphadenopathy noted in the abdomen or pelvis.

Reproductive: Status post hysterectomy. Ovaries are not confidently
identified may be surgically absent or atrophic.

Other: No significant volume of ascites. No pneumoperitoneum.

Musculoskeletal: There are no aggressive appearing lytic or blastic
lesions noted in the visualized portions of the skeleton.
IMPRESSION: 1. Stable postoperative findings, without evidence to suggest
locally recurrent disease or definite metastatic disease in the
abdomen or pelvis.
2. Colonic diverticulosis without evidence of acute diverticulitis
at this time.
3. Aortic atherosclerosis.

## 2022-12-02 ENCOUNTER — Ambulatory Visit: Payer: BC Managed Care – PPO

## 2022-12-12 ENCOUNTER — Inpatient Hospital Stay: Payer: 59 | Attending: Oncology

## 2022-12-15 ENCOUNTER — Telehealth: Payer: Self-pay

## 2022-12-15 NOTE — Telephone Encounter (Signed)
Pt cancelled CT scan and  missed labs apt on 6/24.   Appt on 6/28 will need to be r/s.   Please contact pt to r/s labs (prior) to MD and see if she is ok with r/s CT scan. If so, it will need to be at least 4-7 days prior to MD. Labs can be same day as CT

## 2022-12-15 NOTE — Telephone Encounter (Signed)
Ok to r/s 1 year follow as requested by pt. She will be due for appt in Dec.   Please r/s:  Lab/CT MD 4-7 days after lab/MD

## 2022-12-16 ENCOUNTER — Inpatient Hospital Stay: Payer: 59 | Admitting: Oncology

## 2023-06-05 ENCOUNTER — Other Ambulatory Visit: Payer: Self-pay

## 2023-06-05 ENCOUNTER — Ambulatory Visit
Admission: RE | Admit: 2023-06-05 | Discharge: 2023-06-05 | Disposition: A | Discharge status: Home / Self Care | Payer: 59 | Source: Ambulatory Visit | Attending: Oncology | Admitting: Oncology

## 2023-06-05 ENCOUNTER — Inpatient Hospital Stay: Payer: 59 | Attending: Oncology

## 2023-06-05 DIAGNOSIS — D373 Neoplasm of uncertain behavior of appendix: Secondary | ICD-10-CM | POA: Insufficient documentation

## 2023-06-05 DIAGNOSIS — D751 Secondary polycythemia: Secondary | ICD-10-CM | POA: Insufficient documentation

## 2023-06-05 DIAGNOSIS — R970 Elevated carcinoembryonic antigen [CEA]: Secondary | ICD-10-CM | POA: Diagnosis not present

## 2023-06-05 LAB — CBC WITH DIFFERENTIAL/PLATELET
Abs Immature Granulocytes: 0.02 10*3/uL (ref 0.00–0.07)
Basophils Absolute: 0 10*3/uL (ref 0.0–0.1)
Basophils Relative: 0 %
Eosinophils Absolute: 0.2 10*3/uL (ref 0.0–0.5)
Eosinophils Relative: 2 %
HCT: 45.4 % (ref 36.0–46.0)
Hemoglobin: 15 g/dL (ref 12.0–15.0)
Immature Granulocytes: 0 %
Lymphocytes Relative: 32 %
Lymphs Abs: 2.2 10*3/uL (ref 0.7–4.0)
MCH: 30.3 pg (ref 26.0–34.0)
MCHC: 33 g/dL (ref 30.0–36.0)
MCV: 91.7 fL (ref 80.0–100.0)
Monocytes Absolute: 0.6 10*3/uL (ref 0.1–1.0)
Monocytes Relative: 9 %
Neutro Abs: 3.9 10*3/uL (ref 1.7–7.7)
Neutrophils Relative %: 57 %
Platelets: 258 10*3/uL (ref 150–400)
RBC: 4.95 MIL/uL (ref 3.87–5.11)
RDW: 12 % (ref 11.5–15.5)
WBC: 6.9 10*3/uL (ref 4.0–10.5)
nRBC: 0 % (ref 0.0–0.2)

## 2023-06-05 LAB — COMPREHENSIVE METABOLIC PANEL
ALT: 29 U/L (ref 0–44)
AST: 19 U/L (ref 15–41)
Albumin: 4.1 g/dL (ref 3.5–5.0)
Alkaline Phosphatase: 41 U/L (ref 38–126)
Anion gap: 12 (ref 5–15)
BUN: 27 mg/dL — ABNORMAL HIGH (ref 8–23)
CO2: 30 mmol/L (ref 22–32)
Calcium: 9.1 mg/dL (ref 8.9–10.3)
Chloride: 95 mmol/L — ABNORMAL LOW (ref 98–111)
Creatinine, Ser: 0.65 mg/dL (ref 0.44–1.00)
GFR, Estimated: >60 mL/min (ref >60)
Glucose, Bld: 108 mg/dL — ABNORMAL HIGH (ref 70–99)
Potassium: 3.7 mmol/L (ref 3.5–5.1)
Sodium: 137 mmol/L (ref 135–145)
Total Bilirubin: 1.2 mg/dL — ABNORMAL HIGH (ref <1.2)
Total Protein: 6.9 g/dL (ref 6.5–8.1)

## 2023-06-05 MED ORDER — IOHEXOL 300 MG/ML  SOLN
100.0000 mL | Freq: Once | INTRAMUSCULAR | Status: AC | PRN
  Administered 2023-06-05: 100 mL via INTRAVENOUS

## 2023-06-05 MED ORDER — BARIUM SULFATE 2 % PO SUSP
900.0000 mL | Freq: Once | ORAL | Status: AC
  Administered 2023-06-05: 900 mL via ORAL

## 2023-06-06 LAB — CEA: CEA: 9.9 ng/mL — ABNORMAL HIGH (ref 0.0–4.7)

## 2023-06-07 ENCOUNTER — Other Ambulatory Visit: Payer: Self-pay

## 2023-06-07 ENCOUNTER — Ambulatory Visit: Payer: Self-pay

## 2023-06-16 ENCOUNTER — Ambulatory Visit: Payer: Self-pay | Admitting: Oncology

## 2023-06-19 ENCOUNTER — Inpatient Hospital Stay: Payer: 59 | Admitting: Oncology

## 2023-06-19 ENCOUNTER — Encounter: Payer: Self-pay | Admitting: Oncology

## 2023-06-19 ENCOUNTER — Telehealth: Payer: Self-pay | Admitting: Oncology

## 2023-06-19 DIAGNOSIS — D373 Neoplasm of uncertain behavior of appendix: Secondary | ICD-10-CM | POA: Diagnosis not present

## 2023-06-19 DIAGNOSIS — D751 Secondary polycythemia: Secondary | ICD-10-CM | POA: Diagnosis not present

## 2023-06-19 NOTE — Progress Notes (Signed)
HEMATOLOGY-ONCOLOGY TeleHEALTH VISIT PROGRESS NOTE  I connected with Barbara Johnston on 06/19/23  at  3:30 PM EST by video enabled telemedicine visit and verified that I am speaking with the correct person using two identifiers. I discussed the limitations, risks, security and privacy concerns of performing an evaluation and management service by telemedicine and the availability of in-person appointments. The patient expressed understanding and agreed to proceed.   Other persons participating in the visit and their role in the encounter:  None  Patient's location: Home  Provider's location: office Chief Complaint: HAMN   INTERVAL HISTORY Barbara Johnston is a 62 y.o. female who has above history reviewed by me today presents for follow up visit for management of HAMN Patient preferred to switch to virtual visit due to recent wound on the leg. She is on antibiotics.  A fib on Eliquis 5mg  BID Denies abdominal pain.   Review of Systems  Constitutional:  Negative for appetite change, chills, fatigue and fever.  HENT:   Negative for hearing loss and voice change.   Eyes:  Negative for eye problems.  Respiratory:  Negative for chest tightness and cough.   Cardiovascular:  Negative for chest pain.  Gastrointestinal:  Negative for abdominal distention, abdominal pain and blood in stool.  Endocrine: Negative for hot flashes.  Genitourinary:  Negative for difficulty urinating and frequency.   Musculoskeletal:  Negative for arthralgias.       Wound on lowe extremity  Skin:  Negative for itching and rash.  Neurological:  Negative for extremity weakness.  Hematological:  Negative for adenopathy.  Psychiatric/Behavioral:  Negative for confusion.     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    Atrial fibrillation (HCC)    a.) CHA2DS2-VASc = 3 (sex, HTN, aortic plaque). b.) rate/ryhthm maintained on metoprolol; chronically anticoagulated with reduced dose apixaban. c.) Ablation 05/08/2008. d.) DCCV 01/21/2020. e.) Repeat cardiac ablation 02/26/2020.   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   Hyperlipidemia    Hypertension    Motion sickness    Obesity    PONV (postoperative nausea and vomiting)    Past Surgical History:  Procedure Laterality Date   ABDOMINAL HYSTERECTOMY     CARDIAC ELECTROPHYSIOLOGY MAPPING AND ABLATION N/A 05/08/2008   Procedure: CARDIAC ABLATION WITH PVI x 4; Location: [facility]   CARDIAC ELECTROPHYSIOLOGY MAPPING AND ABLATION N/A 02/26/2020   Procedure: CARDIAC ABLATION; Location: [facility]   CARDIOVERSION N/A 01/21/2020   Procedure: CARDIOVERSION;  Surgeon: Lamar Blinks, MD;  Location: [facility] OR[facility];  Service: Cardiovascular;  Laterality: N/A;   CHOLECYSTECTOMY     COLONOSCOPY WITH PROPOFOL N/A 03/01/2021   Procedure: COLONOSCOPY WITH PROPOFOL;  Surgeon: Jaynie Collins, DO;  Location: [facility] ENDOSCOPY;  Service: Gastroenterology;  Laterality: N/A;   OOPHORECTOMY     XI ROBOTIC LAPAROSCOPIC ASSISTED APPENDECTOMY  04/21/2021   Procedure: XI ROBOT ASSISTED LAPAROSCOPIC APPENDECTOMY;  Surgeon: Carolan Shiver, MD;  Location: [facility] ORS;  Service: General;;    Family History  Problem Relation Age of Onset   Heart disease Father    Heart disease Brother    Breast cancer Neg Hx     Social History  Socioeconomic History   Marital status: Married    Spouse name: Not on file   Number of children: Not on file   Years of education: Not on file   Highest education level: Not on file  Occupational History    Employer: [employer]  Tobacco Use   Smoking status: Never   Smokeless tobacco: Never   Vaping Use   Vaping status: Never Used  Substance and Sexual Activity   Alcohol use: Not Currently   Drug use: Never   Sexual activity: Not on file  Other Topics Concern   Not on file  Social History Narrative   Not on file   Social Drivers of Health   Financial Resource Strain: Not on file  Food Insecurity: Not on file  Transportation Needs: Not on file  Physical Activity: Not on file  Stress: Not on file  Social Connections: Not on file  Intimate Partner Violence: Not on file    Current Outpatient Medications on File Prior to Visit  Medication Sig Dispense Refill   acetaminophen (TYLENOL) 500 MG tablet Take 500 mg by mouth every 6 (six) hours as needed for moderate pain or headache.     apixaban (ELIQUIS) 2.5 MG TABS tablet Take 5 mg by mouth 2 (two) times daily.     atorvastatin (LIPITOR) 20 MG tablet Take 20 mg by mouth daily.      Calcium Carb-Cholecalciferol (CALCIUM 600 + D PO) Take 1 tablet by mouth daily.     docusate sodium (COLACE) 100 MG capsule Take 100 mg by mouth daily.     furosemide (LASIX) 20 MG tablet Take 10 mg by mouth daily.     L-Lysine HCl 500 MG TABS Take 500 mg by mouth daily.      magnesium oxide (MAG-OX) 400 MG tablet Take 800 mg by mouth daily.      Multiple Vitamin (MULTI-VITAMINS) TABS Take 1 tablet by mouth daily.      omeprazole (PRILOSEC) 20 MG capsule Take 20 mg by mouth daily.      potassium chloride (K-DUR) 10 MEQ tablet Take 10 mEq by mouth daily.      Probiotic Product (PROBIOTIC DAILY PO) Take 1 capsule by mouth daily.     vitamin E 400 UNIT capsule Take 400 Units by mouth daily.      metoprolol succinate (TOPROL-XL) 50 MG 24 hr tablet Take 1 tablet (50 mg total) by mouth daily. Take with or immediately following a meal. (Patient not taking: Reported on 06/19/2023) 90 tablet 4   No current facility-administered medications on file prior to visit.    Allergies  Allergen Reactions   Codeine Other (See Comments)    Hallucinations         Observations/Objective: There were no vitals filed for this visit. There is no height or weight on file to calculate BMI.  Physical Exam Neurological:     Mental Status: She is alert.     CBC    Component Value Date/Time   WBC 6.9 06/05/2023 0952   RBC 4.95 06/05/2023 0952   HGB 15.0 06/05/2023 0952   HCT 45.4 06/05/2023 0952   PLT 258 06/05/2023 0952   MCV 91.7 06/05/2023 0952   MCH 30.3 06/05/2023 0952   MCHC 33.0 06/05/2023 0952   RDW 12.0 06/05/2023 0952   LYMPHSABS 2.2 06/05/2023 0952   MONOABS 0.6 06/05/2023 0952   EOSABS 0.2 06/05/2023 0952   BASOSABS 0.0 06/05/2023 0952    CMP     Component  Value Date/Time   NA 137 06/05/2023 0952   K 3.7 06/05/2023 0952   CL 95 (L) 06/05/2023 0952   CO2 30 06/05/2023 0952   GLUCOSE 108 (H) 06/05/2023 0952   BUN 27 (H) 06/05/2023 0952   CREATININE 0.65 06/05/2023 0952   CALCIUM 9.1 06/05/2023 0952   PROT 6.9 06/05/2023 0952   ALBUMIN 4.1 06/05/2023 0952   AST 19 06/05/2023 0952   ALT 29 06/05/2023 0952   ALKPHOS 41 06/05/2023 0952   BILITOT 1.2 (H) 06/05/2023 0952   GFRNONAA >60 06/05/2023 0952   GFRAA >60 01/21/2020 0942     ASSESSMENT & PLAN:   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  06/05/2023 CT showed -Cluster of tiny soft tissue nodules/lymph nodes in the anterior right abdomen I recommend short term CT follow up in 3 months  Erythrocytosis Intermittent issue for patient. 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 Hb and Hct are normalized.   Orders Placed This Encounter  Procedures   CT ABDOMEN PELVIS W CONTRAST    Standing Status:   Future    Expected Date:   09/17/2023    Expiration Date:   06/18/2024    If indicated for the ordered procedure, I authorize the administration of contrast media per Radiology protocol:   Yes    Does the patient have a contrast media/X-ray dye allergy?:   No    Preferred  imaging location?:   [facility]    If indicated for the ordered procedure, I authorize the administration of oral contrast media per Radiology protocol:   Yes   CEA    Standing Status:   Future    Expected Date:   09/17/2023    Expiration Date:   06/18/2024   Follow up in 3 months.  I discussed the assessment and treatment plan with the patient. The patient was provided an opportunity to ask questions and all were answered. The patient agreed with the plan and demonstrated an understanding of the instructions.  The patient was advised to call back or seek an in-person evaluation if the symptoms worsen or if the condition fails to improve as anticipated.    Rickard Patience, MD 06/19/2023 9:54 PM

## 2023-06-19 NOTE — Assessment & Plan Note (Addendum)
#  High-grade appendiceal mucinous neoplasm HAMN, pT4a pN0 M0 CEA elevated - unclear if CEA is related to cancer activity.  06/05/2023 CT showed -Cluster of tiny soft tissue nodules/lymph nodes in the anterior right abdomen I recommend short term CT follow up in 3 months

## 2023-06-19 NOTE — Progress Notes (Signed)
Pt will be started on Keflex due to wound on leg.  Cardiologist has increased Eliquis to 5mg 

## 2023-06-19 NOTE — Telephone Encounter (Signed)
Patient would like a virtual appointment today. She request a call back to let her know if this is possible. [phone] Thank you

## 2023-06-19 NOTE — Assessment & Plan Note (Signed)
Intermittent issue for patient. 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 Hb and Hct are normalized.

## 2023-06-26 ENCOUNTER — Other Ambulatory Visit: Payer: Self-pay | Admitting: Internal Medicine

## 2023-06-26 DIAGNOSIS — Z1231 Encounter for screening mammogram for malignant neoplasm of breast: Secondary | ICD-10-CM

## 2023-07-07 ENCOUNTER — Ambulatory Visit
Admission: RE | Admit: 2023-07-07 | Discharge: 2023-07-07 | Disposition: A | Discharge status: Home / Self Care | Payer: 59 | Source: Ambulatory Visit | Attending: Internal Medicine | Admitting: Internal Medicine

## 2023-07-07 DIAGNOSIS — Z1231 Encounter for screening mammogram for malignant neoplasm of breast: Secondary | ICD-10-CM | POA: Insufficient documentation

## 2023-09-11 ENCOUNTER — Ambulatory Visit
Admission: RE | Admit: 2023-09-11 | Discharge: 2023-09-11 | Disposition: A | Discharge status: Home / Self Care | Payer: 59 | Source: Ambulatory Visit | Attending: Oncology | Admitting: Oncology

## 2023-09-11 ENCOUNTER — Inpatient Hospital Stay: Payer: 59 | Attending: Oncology

## 2023-09-11 DIAGNOSIS — D373 Neoplasm of uncertain behavior of appendix: Secondary | ICD-10-CM | POA: Insufficient documentation

## 2023-09-11 DIAGNOSIS — R970 Elevated carcinoembryonic antigen [CEA]: Secondary | ICD-10-CM | POA: Diagnosis not present

## 2023-09-11 MED ORDER — IOHEXOL 300 MG/ML  SOLN
100.0000 mL | Freq: Once | INTRAMUSCULAR | Status: AC | PRN
Start: 2023-09-11 — End: 2023-09-11
  Administered 2023-09-11: 100 mL via INTRAVENOUS

## 2023-09-12 LAB — CEA: CEA: 7.6 ng/mL — ABNORMAL HIGH (ref 0.0–4.7)

## 2023-09-18 ENCOUNTER — Inpatient Hospital Stay: Payer: 59 | Admitting: Oncology

## 2023-09-18 ENCOUNTER — Encounter: Payer: Self-pay | Admitting: Oncology

## 2023-09-18 VITALS — BP 127/65 | HR 67 | Temp 97.1°F | Resp 18 | Wt 211.6 lb

## 2023-09-18 DIAGNOSIS — D373 Neoplasm of uncertain behavior of appendix: Secondary | ICD-10-CM | POA: Diagnosis not present

## 2023-09-18 NOTE — Progress Notes (Signed)
 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  06/05/2023 CT showed -Cluster of tiny soft tissue nodules/lymph nodes in the anterior right abdomen Repeat CT scan showed that prior nodularity beneath the right mid abdominal wall is not evident on current study.  Continue observation and image surveillance.  I will repeat CT scan in Dec 2025  Orders Placed This Encounter  Procedures   CT CHEST ABDOMEN PELVIS W CONTRAST    Standing Status:   Future    Expected Date:   05/29/2024    Expiration Date:   09/17/2024    If indicated for the ordered procedure, I authorize the administration of contrast media per Radiology protocol:   Yes    Does the patient have a contrast media/X-ray dye allergy?:   No    Preferred imaging location?:   [facility]    If indicated for the ordered procedure, I authorize the administration of oral contrast media per Radiology protocol:   Yes   CBC with Differential (Cancer Center Only)    Standing Status:   Future    Expected Date:   05/27/2024    Expiration Date:   09/17/2024   CMP (Cancer Center only)    Standing Status:   Future    Expected Date:   05/27/2024    Expiration Date:   09/17/2024   CEA    Standing Status:   Future    Expected Date:   05/27/2024    Expiration Date:   09/17/2024   Follow-up in 6 months. All questions were answered. The patient knows to call the clinic with any problems, questions or concerns.  Rickard Patience, MD, PhD [facility] Hematology Oncology 09/18/2023   CHIEF COMPLAINTS/REASON FOR VISIT:  Follow up of  HAMN  HISTORY OF PRESENTING ILLNESS:   Barbara Johnston is a  63 y.o.   female with PMH listed below was seen in consultation at the request of  Danella Penton, MD  for evaluation of [facility]  03/01/2021, colonoscopy showed Perianal skin tags found on perianal exam.- Two 2 to 3 mm polyps in the rectum and in the transverse colon, removed with a cold biopsy forceps. Resected and retrieved. - Non-bleeding internal hemorrhoids  04/02/2021, patient had CT abdomen pelvis with contrast performed for evaluation of abdominal bloating after colonoscopy CT scan showed severely enlarged appendix, without surrounding inflammation.  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  Mild to moderate right hydronephrosis was noted without obstructing calculus or ureteral dilatation.  Aortic atherosclerosis. 04/21/2021, patient underwent a craniectomy with partial cecectomy. 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  Grade 2, moderately differentiated, no lymphovascular invasion.  Acellular mucin invades visceral peritoneum.  Margins were negative.  Patient's case was presented by Dr. Maia Plan on tumor board.  Consensus recommendation was to proceed with right colectomy. 05/12/2021, CT chest with contrast showed no acute abnormality was noted.  Moderate coronary artery calcifications suggesting CAD.  Aortic atherosclerosis 05/19/2021, patient is status post robotic assisted laparoscopic right colectomy.  Patient was sent to establish care with oncology for discussion of post chemotherapy management/surveillance.  #High-grade appendiceal mucinous neoplasm HAMN, pT4a pN0 M0 We discussed about the diagnosis,  nature of the disease.  Because HAMN is a newly created pathological category, very limited data on the behavior of this condition, and this is a highly controversial area.  Although HAMN may appear to have a more aggressive course then LAMNs, currently the clinical management is closer to that of LAMN, then that of mucinous adenocarcinomas. Patient does not  have evidence of distant peritoneal spread.  CEA is chronically elevated.   08/10/21 CT chest abdomen pelvis w contrast showed No acute findings.  No mass or adenopathy. Stable 3 mm right pulmonary nodules.  Persistent right hydronephrosis  She was seen by urology and had a normal right lasix renal scan. indicating an absence of significant urinary outflow obstruction.  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  Final pathology is not available to me in current EMR.  Per patient, margins were negative.  No additional treatments needed.   INTERVAL HISTORY Barbara Johnston is a 63 y.o. female who has above history reviewed by me today presents for follow up visit for management of HAMN She has been doing well clinically.  She denies any unintentional weight loss, abdominal pain, night sweats or fever, change of bowel habits      Review of Systems  Constitutional:  Negative for appetite change, chills, fatigue and fever.  HENT:   Negative for hearing loss and voice change.   Eyes:  Negative for eye problems.  Respiratory:  Negative for chest tightness and cough.   Cardiovascular:  Negative for chest pain.  Gastrointestinal:  Negative for abdominal distention, abdominal pain and blood in stool.  Endocrine: Negative for hot flashes.  Genitourinary:  Negative for difficulty urinating and frequency.   Musculoskeletal:  Negative for arthralgias.  Skin:  Negative for itching and rash.  Neurological:  Negative for extremity weakness.  Hematological:  Negative for adenopathy.  Psychiatric/Behavioral:  Negative for confusion.     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   Atrial fibrillation (HCC)    a.) CHA2DS2-VASc = 3 (sex, HTN, aortic plaque). b.)  rate/ryhthm maintained on metoprolol; chronically anticoagulated with reduced dose apixaban. c.) Ablation 05/08/2008. d.) DCCV 01/21/2020. e.) Repeat cardiac ablation 02/26/2020.   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   Hyperlipidemia    Hypertension    Motion sickness    Obesity    PONV (postoperative nausea and vomiting)     SURGICAL HISTORY: Past Surgical History:  Procedure Laterality Date   ABDOMINAL HYSTERECTOMY     CARDIAC ELECTROPHYSIOLOGY MAPPING AND ABLATION N/A 05/08/2008   Procedure: CARDIAC ABLATION WITH PVI x 4; Location: [facility]   CARDIAC ELECTROPHYSIOLOGY MAPPING AND ABLATION N/A 02/26/2020   Procedure: CARDIAC ABLATION; Location: [facility]   CARDIOVERSION N/A 01/21/2020   Procedure: CARDIOVERSION;  Surgeon: Lamar Blinks, MD;  Location: [facility] ORS;  Service: Cardiovascular;  Laterality: N/A;   CHOLECYSTECTOMY     COLONOSCOPY WITH PROPOFOL N/A 03/01/2021   Procedure: COLONOSCOPY WITH PROPOFOL;  Surgeon: Jaynie Collins, DO;  Location: [facility] ENDOSCOPY;  Service: Gastroenterology;  Laterality: N/A;   OOPHORECTOMY     XI ROBOTIC LAPAROSCOPIC ASSISTED APPENDECTOMY  04/21/2021   Procedure:  XI ROBOT ASSISTED LAPAROSCOPIC APPENDECTOMY;  Surgeon: Carolan Shiver, MD;  Location: [facility] ORS;  Service: General;;    SOCIAL HISTORY: Social History   Socioeconomic History   Marital status: Married    Spouse name: Not on file   Number of children: Not on file   Years of education: Not on file   Highest education level: Not on file  Occupational History    Employer: [employer]  Tobacco Use   Smoking status: Never   Smokeless tobacco: Never  Vaping Use   Vaping status: Never Used  Substance and Sexual Activity   Alcohol use: Not Currently   Drug use: Never   Sexual activity: Not on file  Other Topics  Concern   Not on file  Social History Narrative   Not on file   Social Drivers of Health   Financial Resource Strain: Not on file  Food Insecurity: Not on file  Transportation Needs: Not on file  Physical Activity: Not on file  Stress: Not on file  Social Connections: Not on file  Intimate Partner Violence: Not on file    FAMILY HISTORY: Family History  Problem Relation Age of Onset   Heart disease Father    Heart disease Brother    Breast cancer Neg Hx     ALLERGIES:  is allergic to codeine.  MEDICATIONS:  Current Outpatient Medications  Medication Sig Dispense Refill   acetaminophen (TYLENOL) 500 MG tablet Take 500 mg by mouth every 6 (six) hours as needed for moderate pain or headache.     apixaban (ELIQUIS) 2.5 MG TABS tablet Take 5 mg by mouth 2 (two) times daily.     atorvastatin (LIPITOR) 20 MG tablet Take 20 mg by mouth daily.      Calcium Carb-Cholecalciferol (CALCIUM 600 + D PO) Take 1 tablet by mouth daily.     docusate sodium (COLACE) 100 MG capsule Take 100 mg by mouth daily.     furosemide (LASIX) 20 MG tablet Take 10 mg by mouth daily.     L-Lysine HCl 500 MG TABS Take 500 mg by mouth daily.      magnesium oxide (MAG-OX) 400 MG tablet Take 800 mg by mouth daily.      Multiple Vitamin (MULTI-VITAMINS) TABS Take 1 tablet by mouth daily.      omeprazole (PRILOSEC) 20 MG capsule Take 20 mg by mouth daily.      potassium chloride (K-DUR) 10 MEQ tablet Take 10 mEq by mouth daily.      Probiotic Product (PROBIOTIC DAILY PO) Take 1 capsule by mouth daily.     vitamin E 400 UNIT capsule Take 400 Units by mouth daily.      metoprolol succinate (TOPROL-XL) 50 MG 24 hr tablet Take 1 tablet (50 mg total) by mouth daily. Take with or immediately following a meal. (Patient not taking: Reported on 06/19/2023) 90 tablet 4   No current facility-administered medications for this visit.     PHYSICAL EXAMINATION: ECOG PERFORMANCE STATUS: 0 - Asymptomatic Vitals:    09/18/23 1343  BP: 127/65  Pulse: 67  Resp: 18  Temp: (!) 97.1 F (36.2 C)  SpO2: 97%   Filed Weights   09/18/23 1343  Weight: 211 lb 9.6 oz (96 kg)    Physical Exam Constitutional:      General: She is not in acute distress. HENT:     Head: Normocephalic and atraumatic.  Eyes:     General: No scleral icterus. Cardiovascular:     Rate  and Rhythm: Normal rate.  Pulmonary:     Effort: Pulmonary effort is normal. No respiratory distress.  Abdominal:     General: There is no distension.  Musculoskeletal:        General: No deformity. Normal range of motion.     Cervical back: Normal range of motion and neck supple.  Skin:    Findings: No erythema or rash.  Neurological:     Mental Status: She is alert and oriented to person, place, and time. Mental status is at baseline.  Psychiatric:        Mood and Affect: Mood normal.     LABORATORY DATA:  I have reviewed the data as listed    Latest Ref Rng & Units 06/05/2023    9:52 AM 06/03/2022    9:25 AM 11/29/2021    9:07 AM  CBC  WBC 4.0 - 10.5 K/uL 6.9  8.4  7.0   Hemoglobin 12.0 - 15.0 g/dL 16.1  09.6  04.5   Hematocrit 36.0 - 46.0 % 45.4  48.6  46.1   Platelets 150 - 400 K/uL 258  222  207       Latest Ref Rng & Units 06/05/2023    9:52 AM 06/03/2022    9:25 AM 11/29/2021    9:07 AM  CMP  Glucose 70 - 99 mg/dL 409  811  99   BUN 8 - 23 mg/dL 27  27  31    Creatinine 0.44 - 1.00 mg/dL 9.14  7.82  9.56   Sodium 135 - 145 mmol/L 137  137  138   Potassium 3.5 - 5.1 mmol/L 3.7  4.0  4.1   Chloride 98 - 111 mmol/L 95  99  104   CO2 22 - 32 mmol/L 30  28  28    Calcium 8.9 - 10.3 mg/dL 9.1  9.1  8.8   Total Protein 6.5 - 8.1 g/dL 6.9  7.9  7.4   Total Bilirubin <1.2 mg/dL 1.2  1.0  0.9   Alkaline Phos 38 - 126 U/L 41  55  43   AST 15 - 41 U/L 19  22  20    ALT 0 - 44 U/L 29  22  21       RADIOGRAPHIC STUDIES: I have personally reviewed the radiological images as listed and agreed with the findings in the  report. CT ABDOMEN PELVIS W CONTRAST Result Date: 09/17/2023 CLINICAL DATA:  Appendiceal tumor, follow-up EXAM: CT ABDOMEN AND PELVIS WITH CONTRAST TECHNIQUE: Multidetector CT imaging of the abdomen and pelvis was performed using the standard protocol following bolus administration of intravenous contrast. RADIATION DOSE REDUCTION: This exam was performed according to the departmental dose-optimization program which includes automated exposure control, adjustment of the mA and/or kV according to patient size and/or use of iterative reconstruction technique. CONTRAST:  OMNIPAQUE IOHEXOL 300 MG/ML  SOLN COMPARISON:  06/05/2023 FINDINGS: Lower chest: Mild bibasilar atelectasis. Hepatobiliary: Liver is within normal limits. Status post cholecystectomy. No intrahepatic or extrahepatic ductal dilatation. Pancreas: Within normal limits. Spleen: Within normal limits. Adrenals/Urinary Tract: Adrenal glands are within normal limits. Kidneys are within normal limits. Suspected prior right pyeloplasty. No hydronephrosis. Bladder is normal limits. Stomach/Bowel: Stomach is within normal limits. Status post right hemicolectomy with appendectomy. Mild sigmoid diverticulosis, without diverticulitis. Vascular/Lymphatic: No evidence of abdominal aortic aneurysm. Atherosclerotic calcifications of the abdominal aorta and branch vessels, although vessels remain patent. No suspicious abdominopelvic lymphadenopathy. Reproductive: Status post hysterectomy. No adnexal masses. Other: No abdominopelvic ascites. Prior nodularity  beneath the right mid abdominal wall is not evident on the current study. Musculoskeletal: Mild degenerative changes of the lower thoracic spine. IMPRESSION: Status post right hemicolectomy with appendectomy. No evidence of recurrent or metastatic disease. Electronically Signed   By: Charline Bills M.D.   On: 09/17/2023 16:15

## 2023-09-18 NOTE — Assessment & Plan Note (Addendum)
#  High-grade appendiceal mucinous neoplasm HAMN, pT4a pN0 M0 CEA is chronically elevated - unclear if CEA is related to cancer activity.  06/05/2023 CT showed -Cluster of tiny soft tissue nodules/lymph nodes in the anterior right abdomen Repeat CT scan showed that prior nodularity beneath the right mid abdominal wall is not evident on current study.  Continue observation and image surveillance.  I will repeat CT scan in Dec 2025

## 2023-10-15 ENCOUNTER — Ambulatory Visit
Admission: EM | Admit: 2023-10-15 | Discharge: 2023-10-15 | Disposition: A | Discharge status: Home / Self Care | Attending: Emergency Medicine | Admitting: Emergency Medicine

## 2023-10-15 DIAGNOSIS — J069 Acute upper respiratory infection, unspecified: Secondary | ICD-10-CM

## 2023-10-15 MED ORDER — PROMETHAZINE-DM 6.25-15 MG/5ML PO SYRP: 5.0000 mL | ORAL_SOLUTION | Freq: Every evening | ORAL | 0 refills | Status: DC | PRN

## 2023-10-15 MED ORDER — PREDNISONE 10 MG PO TABS: ORAL_TABLET | ORAL | 0 refills | Status: DC

## 2023-10-15 MED ORDER — AZITHROMYCIN 250 MG PO TABS: 250.0000 mg | ORAL_TABLET | Freq: Every day | ORAL | 0 refills | Status: DC

## 2023-10-15 NOTE — ED Triage Notes (Signed)
 Pt c/o cough, nasal congestion, fatigue, loss of appetite, loss of taste, headache x4days  Pt was tested on Friday for covid and flu and it was negative.  Pt has had 1000mg  tylenol  at 7:40am today for symptoms

## 2023-10-15 NOTE — Discharge Instructions (Addendum)
 Your symptoms today are most likely being caused by a virus and should steadily improve in time it can take up to 7 to 10 days before you truly start to see a turnaround however things will get better, if no improvement seen by Friday may pick up azithromycin   Begin prednisone every morning as directed to open and relax the airway, ideally to settle some of your coughing as well as shortness of breath  Use cough syrup at bedtime if needed to help you rest    You can take Tylenol  and/or Ibuprofen as needed for fever reduction and pain relief.   For cough: honey 1/2 to 1 teaspoon (you can dilute the honey in water or another fluid).  You can also use guaifenesin  and dextromethorphan for cough. You can use a humidifier for chest congestion and cough.  If you don't have a humidifier, you can sit in the bathroom with the hot shower running.      For sore throat: try warm salt water gargles, cepacol lozenges, throat spray, warm tea or water with lemon/honey, popsicles or ice, or OTC cold relief medicine for throat discomfort.   For congestion: take a daily anti-histamine like Zyrtec, Claritin, and a oral decongestant, such as pseudoephedrine.  You can also use Flonase 1-2 sprays in each nostril daily.   It is important to stay hydrated: drink plenty of fluids (water, gatorade/powerade/pedialyte, juices, or teas) to keep your throat moisturized and help further relieve irritation/discomfort.

## 2023-10-15 NOTE — ED Provider Notes (Signed)
 Barbara Johnston    CSN: [id number] Arrival date & time: 10/15/23  0810      History   Chief Complaint Chief Complaint  Patient presents with   Cough    HPI Barbara Johnston is a 63 y.o. female.   Patient presents with subjective fever, chills, nasal congestion, rhinorrhea, productive cough with shortness of breath, intermittent headaches, bilateral ear fullness, sore throat and nausea without vomiting for 4 days.  Decreased appetite due to altered taste.  Has heard crackles in the throat.  Symptoms worsening.  Has attempted use of Robitussin DM and Tylenol .  Completed COVID RSV and flu testing 2 days ago, negative, was not evaluated by provider at that time.   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   Atrial fibrillation (HCC)    a.) CHA2DS2-VASc = 3 (sex, HTN, aortic plaque). b.) rate/ryhthm maintained on metoprolol ; chronically anticoagulated with reduced dose apixaban . c.) Ablation 05/08/2008. d.) DCCV 01/21/2020. e.) Repeat cardiac ablation 02/26/2020.   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   A-fib (HCC) 04/18/2019   Varicose veins of both lower extremities with inflammation 01/30/2019   Chronic venous insufficiency 01/30/2019   Hyperlipidemia 01/30/2019   Essential hypertension 01/30/2019   GERD (gastroesophageal reflux disease) 01/30/2019   Pneumonia 10/13/2017    Vitamin D deficiency 11/09/2016   PVC (premature ventricular contraction) 03/29/2013    Past Surgical History:  Procedure Laterality Date   ABDOMINAL HYSTERECTOMY     CARDIAC ELECTROPHYSIOLOGY MAPPING AND ABLATION N/A 05/08/2008   Procedure: CARDIAC ABLATION WITH PVI x 4; Location: [facility]   CARDIAC ELECTROPHYSIOLOGY MAPPING AND ABLATION N/A 02/26/2020   Procedure: CARDIAC ABLATION; Location: [facility]   CARDIOVERSION N/A 01/21/2020   Procedure: CARDIOVERSION;  Surgeon: Michelle Aid, MD;  Location: [facility];  Service: Cardiovascular;  Laterality: N/A;   CHOLECYSTECTOMY     COLONOSCOPY WITH PROPOFOL  N/A 03/01/2021   Procedure: COLONOSCOPY WITH PROPOFOL ;  Surgeon: Quintin Buckle, DO;  Location: [facility];  Service: Gastroenterology;  Laterality: N/A;   OOPHORECTOMY     XI ROBOTIC LAPAROSCOPIC ASSISTED APPENDECTOMY  04/21/2021   Procedure: XI ROBOT ASSISTED LAPAROSCOPIC APPENDECTOMY;  Surgeon: Eldred Grego, MD;  Location: [facility] OR[facility];  Service: General;;    OB History   No obstetric history on file.      Home Medications    Prior to Admission medications   Medication Sig Start Date End Date Taking? Authorizing Provider  acetaminophen  (TYLENOL ) 500 MG tablet Take 500 mg by mouth every 6 (six) hours as needed for moderate pain or headache.   Yes [provider]  apixaban  (ELIQUIS ) 2.5 MG TABS tablet Take 5 mg by mouth 2 (two) times daily.   Yes [provider]  atorvastatin (LIPITOR) 20 MG tablet Take 20 mg by mouth daily.  01/24/19  Yes [provider]  azithromycin  (ZITHROMAX ) 250 MG tablet Take  1 tablet (250 mg total) by mouth daily. Take first 2 tablets together, then 1 every day until finished. 10/20/23  Yes Brytney Somes R, NP  Calcium Carb-Cholecalciferol (CALCIUM 600 + D PO) Take 1 tablet by mouth daily.   Yes [provider]  docusate sodium  (COLACE) 100 MG capsule Take 100 mg by mouth daily.   Yes [provider]   furosemide  (LASIX ) 20 MG tablet Take 10 mg by mouth daily. 08/12/15  Yes [provider]  L-Lysine  HCl 500 MG TABS Take 500 mg by mouth daily.    Yes [provider]  magnesium  oxide (MAG-OX) 400 MG tablet Take 800 mg by mouth daily.    Yes [provider]  Multiple Vitamin (MULTI-VITAMINS) TABS Take 1 tablet by mouth daily.    Yes [provider]  omeprazole (PRILOSEC) 20 MG capsule Take 20 mg by mouth daily.    Yes [provider]  potassium chloride  (K-DUR) 10 MEQ tablet Take 10 mEq by mouth daily.  08/20/15  Yes [provider]  predniSONE (DELTASONE) 10 MG tablet Take 30 mg (3 tablets) every morning for 2 days, then take 20 mg (2 tablets) every morning for 2 days, then take 10 mg (1 tablet) every morning for 1 day 10/15/23  Yes Chynah Orihuela, Maybelle Spatz, NP  Probiotic Product (PROBIOTIC DAILY PO) Take 1 capsule by mouth daily.   Yes [provider]  promethazine -dextromethorphan (PROMETHAZINE -DM) 6.25-15 MG/5ML syrup Take 5 mLs by mouth at bedtime as needed. 10/15/23  Yes Kareen Hitsman R, NP  vitamin E  400 UNIT capsule Take 400 Units by mouth daily.    Yes [provider]  metoprolol  succinate (TOPROL -XL) 50 MG 24 hr tablet Take 1 tablet (50 mg total) by mouth daily. Take with or immediately following a meal. Patient not taking: Reported on 06/19/2023 01/21/20   Michelle Aid, MD    Family History Family History  Problem Relation Age of Onset   Heart disease Father    Heart disease Brother    Breast cancer Neg Hx     Social History Social History   Tobacco Use   Smoking status: Never   Smokeless tobacco: Never  Vaping Use   Vaping status: Never Used  Substance Use Topics   Alcohol use: Not Currently   Drug use: Never     Allergies   Codeine   Review of Systems Review of Systems   Physical Exam Triage Vital Signs ED Triage Vitals  Encounter Vitals Group     BP 10/15/23 0824 133/74     Systolic BP  Percentile --      Diastolic BP Percentile --      Pulse Rate 10/15/23 0824 74     Resp --      Temp 10/15/23 0824 (!) 100.4 F (38 C)     Temp Source 10/15/23 0824 Oral     SpO2 10/15/23 0824 91 %     Weight 10/15/23 0826 210 lb (95.3 kg)     Height 10/15/23 0826 5\' 2"  (1.575 m)     Head Circumference --      Peak Flow --      Pain Score 10/15/23 0825 6     Pain Loc --      Pain Education --      Exclude from Growth Chart --    No data found.  Updated Vital Signs BP 133/74 (BP Location: Left Arm)   Pulse 74   Temp (!) 100.4 F (  38 C) (Oral)   Ht 5\' 2"  (1.575 m)   Wt 210 lb (95.3 kg)   SpO2 91%   BMI 38.41 kg/m   Visual Acuity Right Eye Distance:   Left Eye Distance:   Bilateral Distance:    Right Eye Near:   Left Eye Near:    Bilateral Near:     Physical Exam Constitutional:      Appearance: Normal appearance.  HENT:     Head: Normocephalic.     Right Ear: Tympanic membrane, ear canal and external ear normal.     Left Ear: Tympanic membrane, ear canal and external ear normal.     Nose: Congestion present.     Mouth/Throat:     Mouth: Mucous membranes are moist.     Pharynx: Oropharynx is clear. No oropharyngeal exudate or posterior oropharyngeal erythema.  Eyes:     Extraocular Movements: Extraocular movements intact.  Cardiovascular:     Rate and Rhythm: Normal rate and regular rhythm.     Pulses: Normal pulses.     Heart sounds: Normal heart sounds.  Pulmonary:     Effort: Pulmonary effort is normal.     Breath sounds: Normal breath sounds.  Musculoskeletal:     Cervical back: Normal range of motion and neck supple.  Neurological:     Mental Status: She is alert and oriented to person, place, and time. Mental status is at baseline.      UC Treatments / Results  Labs (all labs ordered are listed, but only abnormal results are displayed) Labs Reviewed - No data to display  EKG   Radiology No results found.  Procedures Procedures  (including critical care time)  Medications Ordered in UC Medications - No data to display  Initial Impression / Assessment and Plan / UC Course  I have reviewed the triage vital signs and the nursing notes.  Pertinent labs & imaging results that were available during my care of the patient were reviewed by me and considered in my medical decision making (see chart for details).  Viral URI with cough  Patient is in no signs of distress nor toxic appearing.  Vital signs are stable.  Low suspicion for pneumonia, pneumothorax or bronchitis and therefore will defer imaging.  Prescribed prednisone and Promethazine  DM, endorses intolerance to Tessalon  therefore deferred use.  Watch wait antibiotic placed at pharmacy if no improvement seen.May use additional over-the-counter medications as needed for supportive care.  May follow-up with urgent care as needed if symptoms persist or worsen.   Final Clinical Impressions(s) / UC Diagnoses   Final diagnoses:  Viral URI with cough     Discharge Instructions      Your symptoms today are most likely being caused by a virus and should steadily improve in time it can take up to 7 to 10 days before you truly start to see a turnaround however things will get better, if no improvement seen by Friday may pick up azithromycin   Begin prednisone every morning as directed to open and relax the airway, ideally to settle some of your coughing as well as shortness of breath  Use cough syrup at bedtime if needed to help you rest    You can take Tylenol  and/or Ibuprofen as needed for fever reduction and pain relief.   For cough: honey 1/2 to 1 teaspoon (you can dilute the honey in water or another fluid).  You can also use guaifenesin  and dextromethorphan for cough. You can use a humidifier for chest  congestion and cough.  If you don't have a humidifier, you can sit in the bathroom with the hot shower running.      For sore throat: try warm salt water gargles,  cepacol lozenges, throat spray, warm tea or water with lemon/honey, popsicles or ice, or OTC cold relief medicine for throat discomfort.   For congestion: take a daily anti-histamine like Zyrtec, Claritin, and a oral decongestant, such as pseudoephedrine.  You can also use Flonase 1-2 sprays in each nostril daily.   It is important to stay hydrated: drink plenty of fluids (water, gatorade/powerade/pedialyte, juices, or teas) to keep your throat moisturized and help further relieve irritation/discomfort.    ED Prescriptions     Medication Sig Dispense Auth. Provider   predniSONE (DELTASONE) 10 MG tablet Take 30 mg (3 tablets) every morning for 2 days, then take 20 mg (2 tablets) every morning for 2 days, then take 10 mg (1 tablet) every morning for 1 day 11 tablet Karmah Potocki R, NP   promethazine -dextromethorphan (PROMETHAZINE -DM) 6.25-15 MG/5ML syrup Take 5 mLs by mouth at bedtime as needed. 118 mL Jabreel Chimento R, NP   azithromycin  (ZITHROMAX ) 250 MG tablet Take 1 tablet (250 mg total) by mouth daily. Take first 2 tablets together, then 1 every day until finished. 6 tablet Laurice Kimmons R, NP      PDMP not reviewed this encounter.   Reena Canning, [place] 10/15/23 [phone]

## 2023-12-17 NOTE — Progress Notes (Deleted)
 MRN : [id number]  Barbara Johnston is a 63 y.o. (1961-01-01) female who presents with chief complaint of varicose veins hurt.  History of Present Illness:   The patient is seen for evaluation of symptomatic varicose veins. The patient relates burning and stinging which worsened steadily throughout the course of the day, particularly with standing. The patient also notes an aching and throbbing pain over the varicosities, particularly with prolonged dependent positions. The symptoms are significantly improved with elevation.  The patient also notes that during hot weather the symptoms are greatly intensified. The patient states the pain from the varicose veins interferes with work, daily exercise, shopping and household maintenance. At this point, the symptoms are persistent and severe enough that they're having a negative impact on lifestyle and are interfering with daily activities.  There is no history of DVT, PE or superficial thrombophlebitis. There is no history of ulceration or hemorrhage. The patient denies a significant family history of varicose veins.  The patient has not worn graduated compression in the past. At the present time the patient has not been using over-the-counter analgesics. There is no history of prior surgical intervention or sclerotherapy.   No outpatient medications have been marked as taking for the 12/18/23 encounter (Appointment) with Jama, Cordella MATSU, MD.    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   Atrial fibrillation (HCC)    a.) CHA2DS2-VASc = 3 (sex, HTN, aortic plaque). b.) rate/ryhthm maintained on metoprolol ; chronically anticoagulated with reduced dose apixaban . c.) Ablation 05/08/2008. d.) DCCV 01/21/2020. e.) Repeat cardiac ablation  02/26/2020.   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   Hyperlipidemia    Hypertension    Motion sickness    Obesity    PONV (postoperative nausea and vomiting)     Past Surgical History:  Procedure Laterality Date   ABDOMINAL HYSTERECTOMY     CARDIAC ELECTROPHYSIOLOGY MAPPING AND ABLATION N/A 05/08/2008   Procedure: CARDIAC ABLATION WITH PVI x 4; Location: [facility]   CARDIAC ELECTROPHYSIOLOGY MAPPING AND ABLATION N/A 02/26/2020   Procedure: CARDIAC ABLATION; Location: [facility]   CARDIOVERSION N/A 01/21/2020   Procedure: CARDIOVERSION;  Surgeon: Hester Wolm PARAS, MD;  Location: [facility] OR[facility];  Service: Cardiovascular;  Laterality: N/A;   CHOLECYSTECTOMY     COLONOSCOPY WITH PROPOFOL  N/A 03/01/2021   Procedure: COLONOSCOPY WITH PROPOFOL ;  Surgeon: Onita Elspeth Sharper, DO;  Location[facility] ENDOSCOPY;  Service: Gastroenterology;  Laterality: N/A;   OOPHORECTOMY     XI ROBOTIC LAPAROSCOPIC ASSISTED APPENDECTOMY  04/21/2021   Procedure: XI ROBOT ASSISTED LAPAROSCOPIC APPENDECTOMY;  Surgeon: Rodolph Romano, MD;  Location: [facility] ORS;  Service: General;;    Social History Social History   Tobacco Use   Smoking status: Never   Smokeless tobacco: Never  Vaping Use   Vaping status: Never Used  Substance Use Topics   Alcohol use: Not Currently   Drug use: Never    Family History Family History  Problem Relation Age of Onset   Heart disease Father    Heart disease Brother    Breast cancer Neg Hx  Allergies  Allergen Reactions   Codeine Other (See Comments)    Hallucinations      REVIEW OF SYSTEMS (Negative unless checked)  Constitutional: [] Weight loss  [] Fever  [] Chills Cardiac: [] Chest pain   [] Chest pressure   [] Palpitations   [] Shortness of breath when laying flat   [] Shortness of breath with exertion. Vascular:   [] Pain in legs with walking   [x] Pain in legs with standing  [] History of DVT   [] Phlebitis   [] Swelling in legs   [x] Varicose veins   [] Non-healing ulcers Pulmonary:   [] Uses home oxygen   [] Productive cough   [] Hemoptysis   [] Wheeze  [] COPD   [] Asthma Neurologic:  [] Dizziness   [] Seizures   [] History of stroke   [] History of TIA  [] Aphasia   [] Vissual changes   [] Weakness or numbness in arm   [] Weakness or numbness in leg Musculoskeletal:   [] Joint swelling   [] Joint pain   [] Low back pain Hematologic:  [] Easy bruising  [] Easy bleeding   [] Hypercoagulable state   [] Anemic Gastrointestinal:  [] Diarrhea   [] Vomiting  [] Gastroesophageal reflux/heartburn   [] Difficulty swallowing. Genitourinary:  [] Chronic kidney disease   [] Difficult urination  [] Frequent urination   [] Blood in urine Skin:  [] Rashes   [] Ulcers  Psychological:  [] History of anxiety   []  History of major depression.  Physical Examination  There were no vitals filed for this visit. There is no height or weight on file to calculate BMI. Gen: WD/WN, NAD Head: [place]/AT, No temporalis wasting.  Ear/Nose/Throat: Hearing grossly intact, nares w/o erythema or drainage, pinna without lesions Eyes: PER, EOMI, sclera nonicteric.  Neck: Supple, no gross masses.  No JVD.  Pulmonary:  Good air movement, no audible wheezing, no use of accessory muscles.  Cardiac: RRR, precordium not hyperdynamic. Vascular:  Large varicosities present, greater than 10 mm ***.  Veins are tender to palpation  Mild venous stasis changes to the legs bilaterally.  Trace soft pitting edema CEAP [id number] Vessel Right Left  Radial Palpable Palpable  Gastrointestinal: soft, non-distended. No guarding/no peritoneal signs.  Musculoskeletal: M/S 5/5 throughout.  No deformity.  Neurologic: CN 2-12 intact. Pain and light touch intact in extremities.  Symmetrical.  Speech is fluent. Motor exam as listed above. Psychiatric: Judgment intact, Mood & affect appropriate for pt's  clinical situation. Dermatologic: Venous rashes no ulcers noted.  No changes consistent with cellulitis. Lymph : No lichenification or skin changes of chronic lymphedema.  CBC Lab Results  Component Value Date   WBC 6.9 06/05/2023   HGB 15.0 06/05/2023   HCT 45.4 06/05/2023   MCV 91.7 06/05/2023   PLT 258 06/05/2023    BMET    Component Value Date/Time   NA 137 06/05/2023 0952   K 3.7 06/05/2023 0952   CL 95 (L) 06/05/2023 0952   CO2 30 06/05/2023 0952   GLUCOSE 108 (H) 06/05/2023 0952   BUN 27 (H) 06/05/2023 0952   CREATININE 0.65 06/05/2023 0952   CALCIUM 9.1 06/05/2023 0952   GFRNONAA >60 06/05/2023 0952   GFRAA >60 01/21/2020 0942   CrCl cannot be calculated (Patient's most recent lab result is older than the maximum 21 days allowed.).  COAG Lab Results  Component Value Date   INR 1.2 01/21/2020    Radiology No results found.   Assessment/Plan There are no diagnoses linked to this encounter.   Cordella Shawl, MD  12/17/2023 3:27 PM

## 2023-12-18 ENCOUNTER — Encounter (INDEPENDENT_AMBULATORY_CARE_PROVIDER_SITE_OTHER): Payer: Self-pay | Admitting: Vascular Surgery

## 2023-12-18 DIAGNOSIS — I8311 Varicose veins of right lower extremity with inflammation: Secondary | ICD-10-CM

## 2023-12-18 DIAGNOSIS — E782 Mixed hyperlipidemia: Secondary | ICD-10-CM

## 2023-12-18 DIAGNOSIS — I872 Venous insufficiency (chronic) (peripheral): Secondary | ICD-10-CM

## 2023-12-18 DIAGNOSIS — I10 Essential (primary) hypertension: Secondary | ICD-10-CM

## 2023-12-18 DIAGNOSIS — I4891 Unspecified atrial fibrillation: Secondary | ICD-10-CM

## 2024-01-17 ENCOUNTER — Encounter (INDEPENDENT_AMBULATORY_CARE_PROVIDER_SITE_OTHER): Payer: Self-pay | Admitting: Vascular Surgery

## 2024-01-17 ENCOUNTER — Encounter (INDEPENDENT_AMBULATORY_CARE_PROVIDER_SITE_OTHER): Payer: Self-pay

## 2024-02-02 ENCOUNTER — Ambulatory Visit: Attending: Vascular Surgery

## 2024-02-02 DIAGNOSIS — I8312 Varicose veins of left lower extremity with inflammation: Secondary | ICD-10-CM

## 2024-02-02 DIAGNOSIS — I8311 Varicose veins of right lower extremity with inflammation: Secondary | ICD-10-CM

## 2024-02-02 DIAGNOSIS — M7989 Other specified soft tissue disorders: Secondary | ICD-10-CM

## 2024-02-02 NOTE — Progress Notes (Signed)
 Treated pt's BLE spider and small reticular veins with Asclera 1%. Pt was seen by APP prior to start of treatment. She had sclerotherapy years ago at a different office. She received a total of 4 ml/40 mg of Asclera 1%, administered with a 27 gauge butterfly needle. She has very tender to the touch skin and felt some discomfort when I was using alcohol to clean area. She held Eliquis  x 2 days, approved by her MD and bled/bruised easily during the treatment. She stated she is very easily bruised. We initially were going to do 4 vials but opted to start with 2 today and see how the bruising is after treatment. She was put in knee high 20-30 mm Hg compression and given post treatment care instructions at the end of her treatment. She will f/u and is aware we can do more in the future.

## 2024-03-01 ENCOUNTER — Telehealth: Payer: Self-pay

## 2024-03-01 NOTE — Telephone Encounter (Signed)
 Spoke to pt regarding her new onset of intermittent RLE ankle swelling. She is about 1 month s/p sclerotherapy. She noticed some swelling in the last week or so that comes during the day when she is on her feet and resolves by morning or with elevation. She denies any pain. She did recently have a RLE Tetanus shot for scratches from a dog on her arm. She sent me pictures of her small blood blister on R calf area. MD saw them and is in agreement that it should resolve itself without any intervention. Pt was reassured of this. Advised her to f/u with her PCP if the swelling persists. She stated it was currently not swollen today. She will call if she has any further questions/concerns.

## 2024-04-09 ENCOUNTER — Ambulatory Visit

## 2024-04-09 DIAGNOSIS — L719 Rosacea, unspecified: Secondary | ICD-10-CM | POA: Diagnosis not present

## 2024-04-09 MED ORDER — AZELAIC ACID 15 % EX GEL: CUTANEOUS | 5 refills | Status: AC

## 2024-04-09 NOTE — Progress Notes (Signed)
    Subjective   Barbara Johnston is a 63 y.o. female who presents for the following: Rosacea. Patient is new patient  Today patient reports: Rosacea follow up is currently using Soolantra 1% cream prescribed by her previous dermatologist. Current flare started about 1.5 months ago.   Review of Systems:    No other skin or systemic complaints except as noted in HPI or Assessment and Plan.  The following portions of the chart were reviewed this encounter and updated as appropriate: medications, allergies, medical history  Relevant Medical History:  Personal history of non melanoma skin cancer - see medical history for full details   Objective  Well appearing patient in no apparent distress; mood and affect are within normal limits. Examination was performed of the: Focused Exam of: Face   Examination notable for: - Mild, diffuse erythema and telangiectasias involving the central face. Several papules and pustules  Examination limited by: Clothing and Patient deferred removal       Assessment & Plan   Rosacea Chronic and persistent condition with duration or expected duration over one year. Condition is symptomatic and bothersome to patient. Patient is flaring and not currently at treatment goal.   - Reviewed etiology and probable recurrent nature of this condition - Discussed potential triggers including caffeine, heat, sun exposure, chocolate, etc. and recommended patient attempt to identify and avoid triggers - Start compounded Azelaic Acid 15%, Metronidazole 1%, Ivermectin 1% Cream from [facility] - Sun avoidance, protective clothing sunscreen discussed - Telangiectasias will likely not fade completely, laser removal may be considered as a cosmetic procedure - Discussed Doxycycline  if not controlled with topical treatment  Level of service outlined above   Procedures, orders, diagnosis for this visit:    There are no diagnoses linked to this encounter.  Return to  clinic: Return in about 6 weeks (around 05/21/2024) for Rosacea .  I, Emerick Ege, CMA am acting as scribe for Lauraine JAYSON Kanaris, MD   Documentation: I have reviewed the above documentation for accuracy and completeness, and I agree with the above.  Lauraine JAYSON Kanaris, MD

## 2024-04-09 NOTE — Patient Instructions (Addendum)
 Instructions for [facility] Medications  One or more of your medications was sent to the [facility] mail order compounding pharmacy. You will receive an email from them and can purchase the medicine through that link. It will then be mailed to your home at the address you confirmed. If for any reason you do not receive an email from them, please check your spam folder. If you still do not find the email, please let us  know. [facility] phone number is [phone].   Due to recent changes in healthcare laws, you may see results of your pathology and/or laboratory studies on MyChart before the doctors have had a chance to review them. We understand that in some cases there may be results that are confusing or concerning to you. Please understand that not all results are received at the same time and often the doctors may need to interpret multiple results in order to provide you with the best plan of care or course of treatment. Therefore, we ask that you please give us  2 business days to thoroughly review all your results before contacting the office for clarification. Should we see a critical lab result, you will be contacted sooner.   If You Need Anything After Your Visit  If you have any questions or concerns for your doctor, please call our main line at [phone] and press option 4 to reach your doctor's medical assistant. If no one answers, please leave a voicemail as directed and we will return your call as soon as possible. Messages left after 4 pm will be answered the following business day.   You may also send us  a message via MyChart. We typically respond to MyChart messages within 1-2 business days.  For prescription refills, please ask your pharmacy to contact our office. Our fax number is [phone].  If you have an urgent issue when the clinic is closed that cannot wait until the next business day, you can page your doctor at the number below.    Please note that  while we do our best to be available for urgent issues outside of office hours, we are not available 24/7.   If you have an urgent issue and are unable to reach us , you may choose to seek medical care at your doctor's office, retail clinic, urgent care center, or emergency room.  If you have a medical emergency, please immediately call 911 or go to the emergency department.  Pager Numbers  - Dr. Hester: [phone]  - Dr. Jackquline: [phone]  - Dr. Claudene: [phone]   - Dr. Raymund: [phone]  In the event of inclement weather, please call our main line at [phone] for an update on the status of any delays or closures.  Dermatology Medication Tips: Please keep the boxes that topical medications come in in order to help keep track of the instructions about where and how to use these. Pharmacies typically print the medication instructions only on the boxes and not directly on the medication tubes.   If your medication is too expensive, please contact our office at [phone] option 4 or send us  a message through MyChart.   We are unable to tell what your co-pay for medications will be in advance as this is different depending on your insurance coverage. However, we may be able to find a substitute medication at lower cost or fill out paperwork to get insurance to cover a needed medication.   If a prior authorization is required to get your medication covered by  your insurance company, please allow us  1-2 business days to complete this process.  Drug prices often vary depending on where the prescription is filled and some pharmacies may offer cheaper prices.  The website www.goodrx.com contains coupons for medications through different pharmacies. The prices here do not account for what the cost may be with help from insurance (it may be cheaper with your insurance), but the website can give you the price if you did not use any insurance.  - You can print the associated  coupon and take it with your prescription to the pharmacy.  - You may also stop by our office during regular business hours and pick up a GoodRx coupon card.  - If you need your prescription sent electronically to a different pharmacy, notify our office through [facility] or by phone at [phone] option 4.     Si Usted Necesita Algo Despus de Su Visita  Tambin puede enviarnos un mensaje a travs de Clinical cytogeneticist. Por lo general respondemos a los mensajes de MyChart en el transcurso de 1 a 2 das hbiles.  Para renovar recetas, por favor pida a su farmacia que se ponga en contacto con nuestra oficina. Randi lakes de fax es [place] [phone].  Si tiene un asunto urgente cuando la clnica est cerrada y que no puede esperar hasta el siguiente da hbil, puede llamar/localizar a su doctor(a) al nmero que aparece a continuacin.   Por favor, tenga en cuenta que aunque hacemos todo lo posible para estar disponibles para asuntos urgentes fuera del horario de [place], no estamos disponibles las 24 horas del da, los 7 [address]  [address] de la [place].   Si tiene un problema urgente y no puede comunicarse con nosotros, puede optar por buscar atencin mdica  en el consultorio de su doctor(a), en una clnica privada, en un centro de atencin urgente o en una sala de emergencias.  Si tiene Engineer, drilling, por favor llame inmediatamente al 911 o vaya a la sala de emergencias.  Nmeros de bper  - Dr. Hester: [phone]  - Dra. Jackquline: [phone]  - Dr. Claudene: [phone]  - Dra. Kitts: [phone]  En caso de inclemencias del [place], por favor llame a nuestra lnea principal al [phone] para una actualizacin sobre el estado de cualquier retraso o cierre.  Consejos para la medicacin en dermatologa: Por favor, guarde las cajas en las que vienen los medicamentos de uso tpico para ayudarle a seguir las instrucciones sobre dnde y cmo usarlos. Las farmacias generalmente imprimen  las instrucciones del medicamento slo en las cajas y no directamente en los tubos del [place].   Si su medicamento es muy caro, por favor, pngase en contacto con landry rieger llamando al [phone] y presione la opcin 4 o envenos un mensaje a travs de Clinical cytogeneticist.   No podemos decirle cul ser su copago por los medicamentos por adelantado ya que esto es diferente dependiendo de la cobertura de su seguro. Sin embargo, es posible que podamos encontrar un medicamento sustituto a Audiological scientist un formulario para que el seguro cubra el medicamento que se considera necesario.   Si se requiere una autorizacin previa para que su compaa de seguros malta su medicamento, por favor permtanos de 1 a 2 das hbiles para completar este proceso.  Los precios de los medicamentos varan con frecuencia dependiendo del Environmental consultant de dnde se surte la receta y alguna farmacias pueden ofrecer precios ms baratos.  El sitio web www.goodrx.com tiene cupones para medicamentos de Health and safety inspector.  Los precios aqu no tienen en cuenta lo que podra costar con la ayuda del seguro (puede ser ms barato con su seguro), pero el sitio web puede darle el precio si no utiliz Tourist information centre manager.  - Puede imprimir el cupn correspondiente y llevarlo con su receta a la farmacia.  - Tambin puede pasar por nuestra oficina durante el horario de atencin regular y Education officer, museum una tarjeta de cupones de GoodRx.  - Si necesita que su receta se enve electrnicamente a una farmacia diferente, informe a nuestra oficina a travs de MyChart de  o por telfono llamando al [phone] y presione la opcin 4.

## 2024-05-21 ENCOUNTER — Ambulatory Visit

## 2024-05-27 ENCOUNTER — Inpatient Hospital Stay: Attending: Oncology

## 2024-05-27 ENCOUNTER — Ambulatory Visit: Admission: RE | Admit: 2024-05-27 | Discharge: 2024-05-27 | Attending: Oncology

## 2024-05-27 DIAGNOSIS — N1330 Unspecified hydronephrosis: Secondary | ICD-10-CM | POA: Diagnosis not present

## 2024-05-27 DIAGNOSIS — D373 Neoplasm of uncertain behavior of appendix: Secondary | ICD-10-CM

## 2024-05-27 DIAGNOSIS — R970 Elevated carcinoembryonic antigen [CEA]: Secondary | ICD-10-CM | POA: Diagnosis not present

## 2024-05-27 DIAGNOSIS — D490 Neoplasm of unspecified behavior of digestive system: Secondary | ICD-10-CM | POA: Insufficient documentation

## 2024-05-27 LAB — CBC WITH DIFFERENTIAL (CANCER CENTER ONLY)
Abs Immature Granulocytes: 0.03 K/uL (ref 0.00–0.07)
Basophils Absolute: 0 K/uL (ref 0.0–0.1)
Basophils Relative: 0 %
Eosinophils Absolute: 0.1 K/uL (ref 0.0–0.5)
Eosinophils Relative: 1 %
HCT: 42.6 % (ref 36.0–46.0)
Hemoglobin: 14.1 g/dL (ref 12.0–15.0)
Immature Granulocytes: 0 %
Lymphocytes Relative: 21 %
Lymphs Abs: 1.9 K/uL (ref 0.7–4.0)
MCH: 29.9 pg (ref 26.0–34.0)
MCHC: 33.1 g/dL (ref 30.0–36.0)
MCV: 90.4 fL (ref 80.0–100.0)
Monocytes Absolute: 0.7 K/uL (ref 0.1–1.0)
Monocytes Relative: 8 %
Neutro Abs: 6.4 K/uL (ref 1.7–7.7)
Neutrophils Relative %: 70 %
Platelet Count: 225 K/uL (ref 150–400)
RBC: 4.71 MIL/uL (ref 3.87–5.11)
RDW: 12.3 % (ref 11.5–15.5)
WBC Count: 9.2 K/uL (ref 4.0–10.5)
nRBC: 0 % (ref 0.0–0.2)

## 2024-05-27 LAB — CMP (CANCER CENTER ONLY)
ALT: 19 U/L (ref 0–44)
AST: 19 U/L (ref 15–41)
Albumin: 4.2 g/dL (ref 3.5–5.0)
Alkaline Phosphatase: 66 U/L (ref 38–126)
Anion gap: 12 (ref 5–15)
BUN: 32 mg/dL — ABNORMAL HIGH (ref 8–23)
CO2: 26 mmol/L (ref 22–32)
Calcium: 9.5 mg/dL (ref 8.9–10.3)
Chloride: 98 mmol/L (ref 98–111)
Creatinine: 0.68 mg/dL (ref 0.44–1.00)
GFR, Estimated: >60 mL/min (ref >60)
Glucose, Bld: 103 mg/dL — ABNORMAL HIGH (ref 70–99)
Potassium: 3.9 mmol/L (ref 3.5–5.1)
Sodium: 136 mmol/L (ref 135–145)
Total Bilirubin: 0.6 mg/dL (ref 0.0–1.2)
Total Protein: 6.8 g/dL (ref 6.5–8.1)

## 2024-05-27 MED ORDER — IOHEXOL 300 MG/ML  SOLN
100.0000 mL | Freq: Once | INTRAMUSCULAR | Status: AC | PRN
  Administered 2024-05-27: 100 mL via INTRAVENOUS

## 2024-05-28 ENCOUNTER — Other Ambulatory Visit: Payer: Self-pay | Admitting: Internal Medicine

## 2024-05-28 DIAGNOSIS — Z1231 Encounter for screening mammogram for malignant neoplasm of breast: Secondary | ICD-10-CM

## 2024-05-28 LAB — CEA: CEA: 4.7 ng/mL (ref 0.0–4.7)

## 2024-05-30 ENCOUNTER — Ambulatory Visit

## 2024-06-03 ENCOUNTER — Inpatient Hospital Stay: Admitting: Oncology

## 2024-06-03 ENCOUNTER — Encounter: Payer: Self-pay | Admitting: Oncology

## 2024-06-03 VITALS — BP 123/63 | HR 62 | Temp 98.6°F | Resp 19 | Wt 214.8 lb

## 2024-06-03 DIAGNOSIS — D490 Neoplasm of unspecified behavior of digestive system: Secondary | ICD-10-CM | POA: Diagnosis not present

## 2024-06-03 DIAGNOSIS — D373 Neoplasm of uncertain behavior of appendix: Secondary | ICD-10-CM | POA: Diagnosis not present

## 2024-06-03 DIAGNOSIS — Z8679 Personal history of other diseases of the circulatory system: Secondary | ICD-10-CM | POA: Insufficient documentation

## 2024-06-03 DIAGNOSIS — N1330 Unspecified hydronephrosis: Secondary | ICD-10-CM

## 2024-06-03 NOTE — Progress Notes (Signed)
 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  06/05/2023 CT showed -Cluster of tiny soft tissue nodules/lymph nodes in the anterior right abdomen Repeat CT scan showed no signs of recurrence. Right hydronephrosis Continue observation and image surveillance.  I will repeat CT scan in Dec 2026  Hydronephrosis of right kidney Kidney function is stable. She was previously evaluated by urology.  She previously had normal Lasix  renal scan. Continue follow-up with urology   Orders Placed This Encounter  Procedures   CT CHEST ABDOMEN PELVIS W CONTRAST    Standing Status:   Future    Expected Date:   06/03/2025    Expiration Date:   09/01/2025    If indicated for the ordered procedure, I authorize the administration of contrast media per Radiology protocol:   Yes    Does the patient have a contrast media/X-ray dye allergy?:   No    Preferred imaging location?:   [facility]    If indicated for the ordered procedure, I authorize the administration of oral contrast media per Radiology protocol:   Yes   CEA    Standing Status:   Future    Expected Date:   06/03/2025    Expiration Date:   09/01/2025   CBC with Differential (Cancer Center Only)    Standing Status:   Future    Expected Date:   06/03/2025    Expiration Date:   09/01/2025   CMP (Cancer Center only)    Standing Status:   Future    Expected Date:   06/03/2025    Expiration Date:   09/01/2025   Follow-up in 12 months. All questions were answered. The patient knows to call the clinic with any problems, questions or concerns.  Call Babara, MD, PhD [facility] Hematology  Oncology 06/03/2024   CHIEF COMPLAINTS/REASON FOR VISIT:  Follow up of  HAMN  HISTORY OF PRESENTING ILLNESS:   Barbara Johnston is a  63 y.o.  female with PMH listed below was seen in consultation at the request of  Cleotilde Oneil FALCON, MD  for evaluation of [facility]  03/01/2021, colonoscopy showed Perianal skin tags found on perianal exam.- Two 2 to 3 mm polyps in the rectum and in the transverse colon, removed with a cold biopsy forceps. Resected and retrieved. - Non-bleeding internal hemorrhoids  04/02/2021, patient had CT abdomen pelvis with contrast performed for evaluation of abdominal bloating after colonoscopy CT scan showed severely enlarged appendix, without surrounding inflammation.  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  Mild to moderate right hydronephrosis was noted without obstructing calculus or ureteral dilatation.  Aortic atherosclerosis. 04/21/2021, patient underwent a craniectomy with partial cecectomy. 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  Grade 2, moderately differentiated, no lymphovascular invasion.  Acellular mucin invades visceral peritoneum.  Margins were negative.  Patient's case was presented by Dr. Cesar on tumor board.  Consensus recommendation was to proceed with right colectomy. 05/12/2021, CT chest with contrast showed no acute abnormality was noted.  Moderate coronary artery calcifications suggesting CAD.  Aortic atherosclerosis 05/19/2021, patient is status post robotic assisted laparoscopic right colectomy.  Patient was sent to establish care with oncology for discussion  of post chemotherapy management/surveillance.  #High-grade appendiceal mucinous neoplasm HAMN, pT4a pN0 M0 We discussed about the diagnosis, nature of the disease.  Because HAMN is a newly created pathological category, very limited data on the behavior of this condition, and this is a highly controversial area.  Although HAMN may appear to have a  more aggressive course then LAMNs, currently the clinical management is closer to that of LAMN, then that of mucinous adenocarcinomas. Patient does not have evidence of distant peritoneal spread.  CEA is chronically elevated.   08/10/21 CT chest abdomen pelvis w contrast showed No acute findings.  No mass or adenopathy. Stable 3 mm right pulmonary nodules.  Persistent right hydronephrosis  She was seen by urology and had a normal right lasix  renal scan. indicating an absence of significant urinary outflow obstruction.  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  Final pathology is not available to me in current EMR.  Per patient, margins were negative.  No additional treatments needed.   INTERVAL HISTORY Barbara Johnston is a 63 y.o. female who has above history reviewed by me today presents for follow up visit for management of HAMN She has been doing well clinically.  She denies any unintentional weight loss, abdominal pain, night sweats or fever, change of bowel habits      Review of Systems  Constitutional:  Negative for appetite change, chills, fatigue and fever.  HENT:   Negative for hearing loss and voice change.   Eyes:  Negative for eye problems.  Respiratory:  Negative for chest tightness and cough.   Cardiovascular:  Negative for chest pain.  Gastrointestinal:  Negative for abdominal distention, abdominal pain and blood in stool.  Endocrine: Negative for hot flashes.  Genitourinary:  Negative for difficulty urinating and frequency.   Musculoskeletal:  Negative for arthralgias.  Skin:  Negative for itching and rash.  Neurological:  Negative for extremity weakness.  Hematological:  Negative for adenopathy.  Psychiatric/Behavioral:  Negative for confusion.     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   Atrial fibrillation (HCC)    a.) CHA2DS2-VASc = 3 (sex, HTN, aortic plaque). b.) rate/ryhthm maintained on metoprolol ; chronically anticoagulated with reduced dose apixaban . c.) Ablation 05/08/2008. d.) DCCV 01/21/2020. e.) Repeat cardiac ablation 02/26/2020.   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   Hyperlipidemia    Hypertension    Motion sickness    Obesity    PONV (postoperative nausea and vomiting)     SURGICAL HISTORY: Past Surgical History:  Procedure Laterality Date   ABDOMINAL HYSTERECTOMY     CARDIAC ELECTROPHYSIOLOGY MAPPING AND ABLATION N/A 05/08/2008   Procedure: CARDIAC ABLATION WITH PVI x 4; Location: [facility]   CARDIAC ELECTROPHYSIOLOGY MAPPING AND ABLATION N/A 02/26/2020   Procedure: CARDIAC ABLATION; Location: [facility]   CARDIOVERSION N/A 01/21/2020   Procedure: CARDIOVERSION;  Surgeon: Hester Wolm PARAS, MD;  Location: [facility] ORS;  Service: Cardiovascular;  Laterality: N/A;   CHOLECYSTECTOMY     COLONOSCOPY WITH PROPOFOL  N/A 03/01/2021   Procedure: COLONOSCOPY WITH PROPOFOL ;  Surgeon: Onita Elspeth Sharper, DO;  Location: [facility] ENDOSCOPY;  Service: Gastroenterology;  Laterality: N/A;  OOPHORECTOMY     XI ROBOTIC LAPAROSCOPIC ASSISTED APPENDECTOMY  04/21/2021   Procedure: XI ROBOT ASSISTED LAPAROSCOPIC APPENDECTOMY;  Surgeon: Rodolph Romano, MD;  Location: [facility] ORS;  Service: General;;    SOCIAL HISTORY: Social History   Socioeconomic History   Marital status: Married    Spouse name: Not on file   Number of children: Not on file   Years of education: Not on file   Highest education level: Not on file  Occupational History    Employer: [employer]  Tobacco Use   Smoking status: Never   Smokeless tobacco: Never  Vaping Use   Vaping status: Never  Used  Substance and Sexual Activity   Alcohol use: Not Currently   Drug use: Never   Sexual activity: Not on file  Other Topics Concern   Not on file  Social History Narrative   Not on file   Social Drivers of Health   Tobacco Use: Low Risk (06/03/2024)   Patient History    Smoking Tobacco Use: Never    Smokeless Tobacco Use: Never    Passive Exposure: Not on file  Financial Resource Strain: Low Risk  (04/16/2024)   Received from [facility]   Overall Financial Resource Strain (CARDIA)    Difficulty of Paying Living Expenses: Not hard at all  Food Insecurity: No Food Insecurity (04/16/2024)   Received from [facility]   Epic    Within the past 12 months, you worried that your food would run out before you got the money to buy more.: Never true    Within the past 12 months, the food you bought just didn't last and you didn't have money to get more.: Never true  Transportation Needs: No Transportation Needs (04/16/2024)   Received from [facility] - Transportation    In the past 12 months, has lack of transportation kept you from medical appointments or from getting medications?: No    Lack of Transportation (Non-Medical): No  Physical Activity: Not on file  Stress: Not on file  Social Connections: Not on file  Intimate Partner Violence: Not on file  Depression (EYV7-0): Not on file  Alcohol Screen: Not on file  Housing: Low Risk  (04/16/2024)   Received from [facility]   Epic    In the last 12 months, was there a time when you were not able to pay the mortgage or rent on time?: No    In the past 12 months, how many times have you moved where you were living?: 0    At any time in the past 12 months, were you homeless or living in a shelter (including now)?: No  Utilities: Not At Risk (04/16/2024)   Received from [facility]   Epic    In the past 12 months has the electric,  gas, oil, or water company threatened to shut off services in your home?: No  Health Literacy: Not on file    FAMILY HISTORY: Family History  Problem Relation Age of Onset   Heart disease Father    Heart disease Brother    Breast cancer Neg Hx     ALLERGIES:  is allergic to codeine.  MEDICATIONS:  Current Outpatient Medications  Medication Sig Dispense Refill   acetaminophen  (TYLENOL ) 500 MG tablet Take 500 mg by mouth every 6 (six) hours as needed for moderate pain or headache.     apixaban  (ELIQUIS ) 2.5 MG TABS tablet  Take 5 mg by mouth 2 (two) times daily.     atorvastatin (LIPITOR) 20 MG tablet Take 20 mg by mouth daily.      Azelaic Acid  15 % gel After skin is thoroughly washed and patted dry, gently but thoroughly massage a thin film of azelaic acid  gel into the affected area twice daily, in the morning and evening. 50 g 5   cholecalciferol (VITAMIN D3) 25 MCG (1000 UNIT) tablet Take 1,000 Units by mouth daily.     docusate sodium  (COLACE) 100 MG capsule Take 100 mg by mouth daily.     furosemide  (LASIX ) 20 MG tablet Take 10 mg by mouth daily.     L-Lysine  HCl 500 MG TABS Take 500 mg by mouth daily.      magnesium  oxide (MAG-OX) 400 MG tablet Take 800 mg by mouth daily.      Multiple Vitamin (MULTI-VITAMINS) TABS Take 1 tablet by mouth daily.      omeprazole (PRILOSEC) 20 MG capsule Take 20 mg by mouth daily.      potassium chloride  (K-DUR) 10 MEQ tablet Take 10 mEq by mouth daily.      Probiotic Product (PROBIOTIC DAILY PO) Take 1 capsule by mouth daily.     vitamin E  400 UNIT capsule Take 400 Units by mouth daily.      azithromycin  (ZITHROMAX ) 250 MG tablet Take 1 tablet (250 mg total) by mouth daily. Take first 2 tablets together, then 1 every day until finished. 6 tablet 0   Calcium Carb-Cholecalciferol (CALCIUM 600 + D PO) Take 1 tablet by mouth daily.     metoprolol  succinate (TOPROL -XL) 50 MG 24 hr tablet Take 1 tablet (50 mg total) by mouth daily. Take with or  immediately following a meal. (Patient not taking: Reported on 06/03/2024) 90 tablet 4   predniSONE  (DELTASONE ) 10 MG tablet Take 30 mg (3 tablets) every morning for 2 days, then take 20 mg (2 tablets) every morning for 2 days, then take 10 mg (1 tablet) every morning for 1 day 11 tablet 0   promethazine -dextromethorphan (PROMETHAZINE -DM) 6.25-15 MG/5ML syrup Take 5 mLs by mouth at bedtime as needed. 118 mL 0   No current facility-administered medications for this visit.     PHYSICAL EXAMINATION: ECOG PERFORMANCE STATUS: 0 - Asymptomatic Vitals:   06/03/24 1311  BP: 123/63  Pulse: 62  Resp: 19  Temp: 98.6 F (37 C)  SpO2: 97%   Filed Weights   06/03/24 1311  Weight: 214 lb 12.8 oz (97.4 kg)    Physical Exam Constitutional:      General: She is not in acute distress. HENT:     Head: Normocephalic and atraumatic.  Eyes:     General: No scleral icterus. Cardiovascular:     Rate and Rhythm: Normal rate.  Pulmonary:     Effort: Pulmonary effort is normal. No respiratory distress.  Abdominal:     General: There is no distension.  Musculoskeletal:        General: No deformity. Normal range of motion.     Cervical back: Normal range of motion and neck supple.  Skin:    Findings: No erythema or rash.  Neurological:     Mental Status: She is alert and oriented to person, place, and time. Mental status is at baseline.  Psychiatric:        Mood and Affect: Mood normal.     LABORATORY DATA:  I have reviewed the data as listed    Latest Ref Rng &  Units 05/27/2024    2:11 PM 06/05/2023    9:52 AM 06/03/2022    9:25 AM  CBC  WBC 4.0 - 10.5 K/uL 9.2  6.9  8.4   Hemoglobin 12.0 - 15.0 g/dL 85.8  84.9  84.0   Hematocrit 36.0 - 46.0 % 42.6  45.4  48.6   Platelets 150 - 400 K/uL 225  258  222       Latest Ref Rng & Units 05/27/2024    2:12 PM 06/05/2023    9:52 AM 06/03/2022    9:25 AM  CMP  Glucose 70 - 99 mg/dL 896  891  897   BUN 8 - 23 mg/dL 32  27  27   Creatinine  0.44 - 1.00 mg/dL 9.31  9.34  9.32   Sodium 135 - 145 mmol/L 136  137  137   Potassium 3.5 - 5.1 mmol/L 3.9  3.7  4.0   Chloride 98 - 111 mmol/L 98  95  99   CO2 22 - 32 mmol/L 26  30  28    Calcium 8.9 - 10.3 mg/dL 9.5  9.1  9.1   Total Protein 6.5 - 8.1 g/dL 6.8  6.9  7.9   Total Bilirubin 0.0 - 1.2 mg/dL 0.6  1.2  1.0   Alkaline Phos 38 - 126 U/L 66  41  55   AST 15 - 41 U/L 19  19  22    ALT 0 - 44 U/L 19  29  22       RADIOGRAPHIC STUDIES: I have personally reviewed the radiological images as listed and agreed with the findings in the report. CT CHEST ABDOMEN PELVIS W CONTRAST Result Date: 05/28/2024 CLINICAL DATA:  Appendiceal tumor, status post right hemicolectomy and appendectomy * Tracking Code: BO * EXAM: CT CHEST, ABDOMEN, AND PELVIS WITH CONTRAST TECHNIQUE: Multidetector CT imaging of the chest, abdomen and pelvis was performed following the standard protocol during bolus administration of intravenous contrast. RADIATION DOSE REDUCTION: This exam was performed according to the departmental dose-optimization program which includes automated exposure control, adjustment of the mA and/or kV according to patient size and/or use of iterative reconstruction technique. CONTRAST:  OMNIPAQUE  IOHEXOL  300 MG/ML  SOLN COMPARISON:  09/11/2023 FINDINGS: CT CHEST FINDINGS Cardiovascular: Aortic atherosclerosis. Normal heart size. Left and right coronary artery calcifications. No pericardial effusion. Mediastinum/Nodes: No enlarged mediastinal, hilar, or axillary lymph nodes. Thyroid  gland, trachea, and esophagus demonstrate no significant findings. Lungs/Pleura: Lungs are clear. No pleural effusion or pneumothorax. Musculoskeletal: No chest wall abnormality. No acute osseous findings. CT ABDOMEN PELVIS FINDINGS Hepatobiliary: No solid liver abnormality is seen. Cholecystectomy. Mild postoperative biliary ductal dilatation. Pancreas: Unremarkable. No pancreatic ductal dilatation or surrounding  inflammatory changes. Spleen: Normal in size without significant abnormality. Adrenals/Urinary Tract: Adrenal glands are unremarkable. Mild right hydronephrosis with caliber change at the ureteropelvic junction. No calculus or other obstruction. Left kidney is normal, without renal calculi, solid lesion, or hydronephrosis. Bladder is unremarkable. Stomach/Bowel: Stomach is within normal limits. Right hemicolectomy and appendectomy with ileocolic anastomosis. No evidence of bowel wall thickening, distention, or inflammatory changes. Sigmoid diverticulosis. Vascular/Lymphatic: Aortic atherosclerosis. No enlarged abdominal or pelvic lymph nodes. Reproductive: Hysterectomy. Other: No abdominal wall hernia or abnormality. No ascites. Musculoskeletal: No acute osseous findings. IMPRESSION: 1. Right hemicolectomy and appendectomy with ileocolic anastomosis. 2. No evidence of recurrent or metastatic disease in the chest, abdomen, or pelvis. 3. Mild right hydronephrosis with caliber change at the ureteropelvic junction. No calculus or other obstruction visible. 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 Nuclear scintigraphic Lasix  renogram can be considered to assess for functional significance. 4. Coronary artery disease. Aortic Atherosclerosis (ICD10-I70.0). Electronically Signed   By: Marolyn JONETTA Jaksch M.D.   On: 05/28/2024 06:24

## 2024-06-03 NOTE — Assessment & Plan Note (Signed)
 Kidney function is stable. She was previously evaluated by urology.  She previously had normal Lasix  renal scan. Continue follow-up with urology

## 2024-06-03 NOTE — Assessment & Plan Note (Signed)
#  High-grade appendiceal mucinous neoplasm HAMN, pT4a pN0 M0 CEA is chronically elevated - unclear if CEA is related to cancer activity.  06/05/2023 CT showed -Cluster of tiny soft tissue nodules/lymph nodes in the anterior right abdomen Repeat CT scan showed no signs of recurrence. Right hydronephrosis Continue observation and image surveillance.  I will repeat CT scan in Dec 2026

## 2024-06-04 ENCOUNTER — Telehealth: Payer: Self-pay | Admitting: Oncology

## 2024-06-04 ENCOUNTER — Encounter: Payer: Self-pay | Admitting: Oncology

## 2024-06-04 NOTE — Telephone Encounter (Signed)
 Called pt to sched CT - pt confirmed date/time/location - pt requested appt reminder via mail - LH

## 2024-06-24 ENCOUNTER — Ambulatory Visit

## 2024-06-24 DIAGNOSIS — L719 Rosacea, unspecified: Secondary | ICD-10-CM

## 2024-06-24 DIAGNOSIS — L720 Epidermal cyst: Secondary | ICD-10-CM | POA: Diagnosis not present

## 2024-06-24 MED ORDER — TRETINOIN 0.025 % EX CREA: TOPICAL_CREAM | Freq: Every day | CUTANEOUS | 1 refills | Status: AC

## 2024-06-24 NOTE — Patient Instructions (Addendum)
 Topical Retinoids:    What are Topical Retinoids? - These are prescription topical medications, used to treat acne, sun damage, fine wrinkles, and several other skin changes on the face. Medications in this family include tretinoin/Retin-A, adapalene/Differin, and Tazorac, among others.   Instructions for Use: 1. These medications should be used at night because some can be inactivated by sunlight. 2. Wash the face with gentle cleanser such as Cetaphil (no salicylic acid or other acne-washes, which can break down the medication), and let it dry completely before applying the topical retinoid.  It is best to wait 10-15 minutes after washing to apply the medicine. 3. Use a small (pea sized) amount of medication with each application. This should cover the whole face.  Apply by dotting small amounts on the skin, and then blend. Avoid applying medication to the areas right around eyes and lips.   4. Topical retinoids can be drying/irritating to the skin, which varies from person to person, and with the strength and type of the medication used. We often recommend starting to use the topical retinoid medication every 2-3 nights, and increase the frequency gradually up to nightly as tolerated over several weeks' time. This irritation typically improves as time goes by, and your skin will respond better if you are able to use the medication more consistently.  5. If you are still experiencing dryness/irritation, you may apply a moisturizer after your toipcal retinoid, such as Cerave PM, Cetaphil, or other bland facial moisturizer. Do not use other night creams or anti-aging creams.  6. These medications take 6 weeks of consistent use to see benefits.    A Note on Cosmetic Use: - Please note, if you are over the age of 35, insurance will typically not cover these medications. If they are recommended to you for non-medically necessary reasons, you may choose to pay for the medication out of pocket. Prices  vary, but a tube of generic tretinoin can often be purchased for ~$60-100 (this size often lasts several months). This varies considerably, and we recommended that you check www.goodrx.com for prescription discount coupons and to compare prices across pharmacies.   These medications should not be used if you are pregnant, planning to become pregnant soon, or breastfeeding.   Please do not hesitate to contact our office with any questions regarding your medication.    Due to recent changes in healthcare laws, you may see results of your pathology and/or laboratory studies on MyChart before the doctors have had a chance to review them. We understand that in some cases there may be results that are confusing or concerning to you. Please understand that not all results are received at the same time and often the doctors may need to interpret multiple results in order to provide you with the best plan of care or course of treatment. Therefore, we ask that you please give us  2 business days to thoroughly review all your results before contacting the office for clarification. Should we see a critical lab result, you will be contacted sooner.   If You Need Anything After Your Visit  If you have any questions or concerns for your doctor, please call our main line at [phone] and press option 4 to reach your doctor's medical assistant. If no one answers, please leave a voicemail as directed and we will return your call as soon as possible. Messages left after 4 pm will be answered the following business day.   You may also send us  a message via MyChart.  We typically respond to MyChart messages within 1-2 business days.  For prescription refills, please ask your pharmacy to contact our office. Our fax number is [phone].  If you have an urgent issue when the clinic is closed that cannot wait until the next business day, you can page your doctor at the number below.    Please note that while we do  our best to be available for urgent issues outside of office hours, we are not available 24/7.   If you have an urgent issue and are unable to reach us , you may choose to seek medical care at your doctor's office, retail clinic, urgent care center, or emergency room.  If you have a medical emergency, please immediately call 911 or go to the emergency department.  Pager Numbers  - Dr. Hester: [phone]  - Dr. Jackquline: [phone]  - Dr. Claudene: [phone]   - Dr. Raymund: [phone]  In the event of inclement weather, please call our main line at [phone] for an update on the status of any delays or closures.  Dermatology Medication Tips: Please keep the boxes that topical medications come in in order to help keep track of the instructions about where and how to use these. Pharmacies typically print the medication instructions only on the boxes and not directly on the medication tubes.   If your medication is too expensive, please contact our office at [phone] option 4 or send us  a message through MyChart.   We are unable to tell what your co-pay for medications will be in advance as this is different depending on your insurance coverage. However, we may be able to find a substitute medication at lower cost or fill out paperwork to get insurance to cover a needed medication.   If a prior authorization is required to get your medication covered by your insurance company, please allow us  1-2 business days to complete this process.  Drug prices often vary depending on where the prescription is filled and some pharmacies may offer cheaper prices.  The website www.goodrx.com contains coupons for medications through different pharmacies. The prices here do not account for what the cost may be with help from insurance (it may be cheaper with your insurance), but the website can give you the price if you did not use any insurance.  - You can print the associated coupon and take  it with your prescription to the pharmacy.  - You may also stop by our office during regular business hours and pick up a GoodRx coupon card.  - If you need your prescription sent electronically to a different pharmacy, notify our office through [facility] or by phone at [phone] option 4.     Si Usted Necesita Algo Despus de Su Visita  Tambin puede enviarnos un mensaje a travs de Clinical cytogeneticist. Por lo general respondemos a los mensajes de MyChart en el transcurso de 1 a 2 das hbiles.  Para renovar recetas, por favor pida a su farmacia que se ponga en contacto con nuestra oficina. Randi lakes de fax es [place] [phone].  Si tiene un asunto urgente cuando la clnica est cerrada y que no puede esperar hasta el siguiente da hbil, puede llamar/localizar a su doctor(a) al nmero que aparece a continuacin.   Por favor, tenga en cuenta que aunque hacemos todo lo posible para estar disponibles para asuntos urgentes fuera del horario de [place], no estamos disponibles las 24 horas del da, los 7 [address]  [address] de la [place].   Si tiene  un problema urgente y no puede comunicarse con nosotros, puede optar por buscar atencin mdica  en el consultorio de su doctor(a), en una clnica privada, en un centro de atencin urgente o en una sala de emergencias.  Si tiene Engineer, drilling, por favor llame inmediatamente al 911 o vaya a la sala de emergencias.  Nmeros de bper  - Dr. Hester: [phone]  - Dra. Jackquline: [phone]  - Dr. Claudene: [phone]  - Dra. Kitts: [phone]  En caso de inclemencias del [place], por favor llame a nuestra lnea principal al [phone] para una actualizacin sobre el estado de cualquier retraso o cierre.  Consejos para la medicacin en dermatologa: Por favor, guarde las cajas en las que vienen los medicamentos de uso tpico para ayudarle a seguir las instrucciones sobre dnde y cmo usarlos. Las farmacias generalmente imprimen las  instrucciones del medicamento slo en las cajas y no directamente en los tubos del [place].   Si su medicamento es muy caro, por favor, pngase en contacto con landry rieger llamando al [phone] y presione la opcin 4 o envenos un mensaje a travs de Clinical cytogeneticist.   No podemos decirle cul ser su copago por los medicamentos por adelantado ya que esto es diferente dependiendo de la cobertura de su seguro. Sin embargo, es posible que podamos encontrar un medicamento sustituto a Audiological scientist un formulario para que el seguro cubra el medicamento que se considera necesario.   Si se requiere una autorizacin previa para que su compaa de seguros malta su medicamento, por favor permtanos de 1 a 2 das hbiles para completar este proceso.  Los precios de los medicamentos varan con frecuencia dependiendo del Environmental consultant de dnde se surte la receta y alguna farmacias pueden ofrecer precios ms baratos.  El sitio web www.goodrx.com tiene cupones para medicamentos de Health and safety inspector. Los precios aqu no tienen en cuenta lo que podra costar con la ayuda del seguro (puede ser ms barato con su seguro), pero el sitio web puede darle el precio si no utiliz Tourist information centre manager.  - Puede imprimir el cupn correspondiente y llevarlo con su receta a la farmacia.  - Tambin puede pasar por nuestra oficina durante el horario de atencin regular y Education officer, museum una tarjeta de cupones de GoodRx.  - Si necesita que su receta se enve electrnicamente a una farmacia diferente, informe a nuestra oficina a travs de MyChart de [place] o por telfono llamando al [phone] y presione la opcin 4.

## 2024-06-24 NOTE — Progress Notes (Signed)
" °  °  Subjective   Barbara Johnston is a 64 y.o. female who presents for the following: Follow up of rosacea. Patient is established patient   Today patient reports: Patient has noticed improvement in rash and redness with topicals and oral Prednisone .   Still with firm bumps on cheeks   Review of Systems:    No other skin or systemic complaints except as noted in HPI or Assessment and Plan.  The following portions of the chart were reviewed this encounter and updated as appropriate: medications, allergies, medical history  Relevant Medical History:  n/a   Objective  (SKPE) Well appearing patient in no apparent distress; mood and affect are within normal limits. Examination was performed of the: Focused Exam of: Face   Examination notable for: - Multiple 2-3 mm white papules located on the face Background telangiectasias  Examination limited by: Makeup , Clothing, and Patient deferred removal       Assessment & Plan  (SKAP)   Rosacea Chronic and persistent condition with duration or expected duration over one year. Condition is improving with treatment but not currently at goal. - with intermittent flares, background telangectasia  - Reviewed etiology and probable recurrent nature of this condition - Discussed potential triggers including caffeine, heat, sun exposure, chocolate, etc. and recommended patient attempt to identify and avoid triggers - Continue compounded Azelaic Acid  15%, Metronidazole 1%, Ivermectin 1% Cream from [facility] - Sun avoidance, protective clothing sunscreen discussed - Telangiectasias will likely not fade completely, laser removal may be considered as a cosmetic procedure - Discussed Doxycycline  if not controlled with topical treatment  Milia of bilateral cheeks  - Discussed the diagnosis and reassured the pt regarding the benign nature of this condition - Discussed extraction and out of pocket cost, deferred - Start tretinoin  0.025% cream in the  evening. Educated patient about proper use and potential side effects, including dryness, irritation, sun sensitivity, and transient worsening of acne.       Was sun protection counseling provided?: Yes   Level of service outlined above   Patient instructions (SKPI)   Procedures, orders, diagnosis for this visit:    There are no diagnoses linked to this encounter.  Return to clinic: Return in about 6 months (around 12/22/2024) for Rosacea.  I, Emerick Ege, CMA am acting as scribe for Lauraine JAYSON Kanaris, MD.   Documentation: I have reviewed the above documentation for accuracy and completeness, and I agree with the above.  Lauraine JAYSON Kanaris, MD  "

## 2024-07-10 ENCOUNTER — Encounter

## 2024-07-22 ENCOUNTER — Ambulatory Visit
Admission: EM | Admit: 2024-07-22 | Discharge: 2024-07-22 | Disposition: A | Discharge status: Home / Self Care | Source: Home / Self Care | Attending: Emergency Medicine | Admitting: Emergency Medicine

## 2024-07-22 DIAGNOSIS — J0140 Acute pansinusitis, unspecified: Secondary | ICD-10-CM

## 2024-07-22 LAB — POC COVID19/FLU A&B COMBO
Covid Antigen, POC: NEGATIVE
Influenza A Antigen, POC: NEGATIVE
Influenza B Antigen, POC: NEGATIVE

## 2024-07-22 MED ORDER — AMOXICILLIN-POT CLAVULANATE 875-125 MG PO TABS: 1.0000 | ORAL_TABLET | Freq: Two times a day (BID) | ORAL | 0 refills | Status: AC

## 2024-07-22 MED ORDER — FLUTICASONE PROPIONATE 50 MCG/ACT NA SUSP: 2.0000 | Freq: Every day | NASAL | 0 refills | Status: AC

## 2024-07-22 NOTE — ED Triage Notes (Signed)
 Patient had a heart ablation at [facility]  Sx 2 days  Fever Headache Cough Nasal congestion

## 2024-07-22 NOTE — Discharge Instructions (Signed)
 COVID and flu are negative.  Start Mucinex  to keep the mucous thin and to decongest you.   You may take  1000 mg of tylenol  up to 3-4 times a day as needed for pain.  Flonase  will help with the nasal congestion, sinus pain and pressure.  Most sinus infections are viral and do not need antibiotics unless you have a high fever, have had this for 10 days, or you get better and then get sick again. Use a NeilMed sinus rinse with distilled water as often as you want to to reduce nasal congestion. Follow the directions on the box.   Go to www.goodrx.com to look up your medications. This will give you a list of where you can find your prescriptions at the most affordable prices. Or you can ask the pharmacist what the cash price is. This is frequently cheaper than going through insurance.

## 2024-08-08 ENCOUNTER — Encounter

## 2024-12-23 ENCOUNTER — Ambulatory Visit

## 2025-06-03 ENCOUNTER — Other Ambulatory Visit

## 2025-06-03 ENCOUNTER — Inpatient Hospital Stay

## 2025-06-17 ENCOUNTER — Inpatient Hospital Stay: Admitting: Oncology
